# Patient Record
Sex: Male | Born: 1950 | Race: White | Hispanic: No | Marital: Married | Smoking: Former smoker
Health system: Southern US, Community
[De-identification: ages and names within clinical notes are randomized; demographics above are authoritative.]

## PROBLEM LIST (undated history)

## (undated) DIAGNOSIS — E039 Hypothyroidism, unspecified: Secondary | ICD-10-CM

## (undated) DIAGNOSIS — M545 Low back pain, unspecified: Secondary | ICD-10-CM

## (undated) DIAGNOSIS — F419 Anxiety disorder, unspecified: Secondary | ICD-10-CM

## (undated) DIAGNOSIS — I2510 Atherosclerotic heart disease of native coronary artery without angina pectoris: Secondary | ICD-10-CM

## (undated) DIAGNOSIS — I10 Essential (primary) hypertension: Secondary | ICD-10-CM

## (undated) DIAGNOSIS — C801 Malignant (primary) neoplasm, unspecified: Secondary | ICD-10-CM

## (undated) DIAGNOSIS — L570 Actinic keratosis: Secondary | ICD-10-CM

## (undated) DIAGNOSIS — G4730 Sleep apnea, unspecified: Secondary | ICD-10-CM

## (undated) DIAGNOSIS — R7303 Prediabetes: Secondary | ICD-10-CM

## (undated) DIAGNOSIS — D649 Anemia, unspecified: Secondary | ICD-10-CM

## (undated) DIAGNOSIS — N400 Enlarged prostate without lower urinary tract symptoms: Secondary | ICD-10-CM

## (undated) DIAGNOSIS — M1990 Unspecified osteoarthritis, unspecified site: Secondary | ICD-10-CM

## (undated) DIAGNOSIS — E119 Type 2 diabetes mellitus without complications: Secondary | ICD-10-CM

## (undated) HISTORY — PX: OTHER SURGICAL HISTORY: SHX169

## (undated) HISTORY — PX: THYROID SURGERY: SHX805

## (undated) HISTORY — PX: CORONARY ANGIOPLASTY: SHX604

## (undated) HISTORY — PX: SEPTOPLASTY: SUR1290

## (undated) HISTORY — DX: Type 2 diabetes mellitus without complications: E11.9

## (undated) HISTORY — PX: CARDIAC CATHETERIZATION: SHX172

## (undated) HISTORY — PX: SKIN CANCER EXCISION: SHX779

## (undated) HISTORY — DX: Actinic keratosis: L57.0

## (undated) HISTORY — PX: HERNIA REPAIR: SHX51

---

## 2014-01-14 DEATH — deceased

## 2014-04-08 DIAGNOSIS — I10 Essential (primary) hypertension: Secondary | ICD-10-CM | POA: Insufficient documentation

## 2015-09-28 DIAGNOSIS — G4733 Obstructive sleep apnea (adult) (pediatric): Secondary | ICD-10-CM | POA: Insufficient documentation

## 2015-09-28 DIAGNOSIS — Z9989 Dependence on other enabling machines and devices: Secondary | ICD-10-CM

## 2016-09-26 ENCOUNTER — Other Ambulatory Visit: Payer: Self-pay | Admitting: Otolaryngology

## 2016-09-26 DIAGNOSIS — H903 Sensorineural hearing loss, bilateral: Secondary | ICD-10-CM

## 2016-09-26 DIAGNOSIS — IMO0001 Reserved for inherently not codable concepts without codable children: Secondary | ICD-10-CM

## 2016-10-03 ENCOUNTER — Ambulatory Visit
Admission: RE | Admit: 2016-10-03 | Discharge: 2016-10-03 | Disposition: A | Discharge status: Home / Self Care | Payer: Medicare HMO | Source: Ambulatory Visit | Attending: Otolaryngology | Admitting: Otolaryngology

## 2016-10-03 ENCOUNTER — Other Ambulatory Visit
Admission: RE | Admit: 2016-10-03 | Discharge: 2016-10-03 | Disposition: A | Discharge status: Home / Self Care | Payer: Medicare HMO | Source: Ambulatory Visit | Attending: Otolaryngology | Admitting: Otolaryngology

## 2016-10-03 DIAGNOSIS — R42 Dizziness and giddiness: Secondary | ICD-10-CM | POA: Diagnosis present

## 2016-10-03 DIAGNOSIS — G936 Cerebral edema: Secondary | ICD-10-CM | POA: Diagnosis not present

## 2016-10-03 DIAGNOSIS — IMO0001 Reserved for inherently not codable concepts without codable children: Secondary | ICD-10-CM

## 2016-10-03 DIAGNOSIS — H903 Sensorineural hearing loss, bilateral: Secondary | ICD-10-CM | POA: Diagnosis not present

## 2016-10-03 DIAGNOSIS — H905 Unspecified sensorineural hearing loss: Secondary | ICD-10-CM | POA: Diagnosis present

## 2016-10-03 LAB — CREATININE, SERUM: Creatinine, Ser: 1.01 mg/dL (ref 0.61–1.24)

## 2016-10-03 MED ORDER — GADOBENATE DIMEGLUMINE 529 MG/ML IV SOLN
20.0000 mL | Freq: Once | INTRAVENOUS | Status: AC | PRN
  Administered 2016-10-03: 20 mL via INTRAVENOUS

## 2017-04-24 DIAGNOSIS — H8109 Meniere's disease, unspecified ear: Secondary | ICD-10-CM | POA: Insufficient documentation

## 2017-10-29 DIAGNOSIS — G629 Polyneuropathy, unspecified: Secondary | ICD-10-CM | POA: Insufficient documentation

## 2017-11-07 DIAGNOSIS — R200 Anesthesia of skin: Secondary | ICD-10-CM | POA: Insufficient documentation

## 2017-12-27 DIAGNOSIS — D239 Other benign neoplasm of skin, unspecified: Secondary | ICD-10-CM

## 2017-12-27 HISTORY — DX: Other benign neoplasm of skin, unspecified: D23.9

## 2017-12-31 ENCOUNTER — Ambulatory Visit: Payer: Medicare HMO | Admitting: Internal Medicine

## 2017-12-31 DIAGNOSIS — M5136 Other intervertebral disc degeneration, lumbar region: Secondary | ICD-10-CM | POA: Insufficient documentation

## 2018-01-01 ENCOUNTER — Other Ambulatory Visit: Payer: Self-pay | Admitting: Internal Medicine

## 2018-01-01 DIAGNOSIS — R911 Solitary pulmonary nodule: Secondary | ICD-10-CM

## 2018-01-08 ENCOUNTER — Ambulatory Visit
Admission: RE | Admit: 2018-01-08 | Discharge: 2018-01-08 | Disposition: A | Discharge status: Home / Self Care | Payer: Medicare HMO | Source: Ambulatory Visit | Attending: Internal Medicine | Admitting: Internal Medicine

## 2018-01-08 DIAGNOSIS — R911 Solitary pulmonary nodule: Secondary | ICD-10-CM

## 2018-01-08 DIAGNOSIS — I2510 Atherosclerotic heart disease of native coronary artery without angina pectoris: Secondary | ICD-10-CM | POA: Diagnosis not present

## 2018-01-08 DIAGNOSIS — I700 Atherosclerosis of aorta: Secondary | ICD-10-CM | POA: Diagnosis not present

## 2018-01-08 DIAGNOSIS — R918 Other nonspecific abnormal finding of lung field: Secondary | ICD-10-CM | POA: Diagnosis not present

## 2018-01-08 DIAGNOSIS — K760 Fatty (change of) liver, not elsewhere classified: Secondary | ICD-10-CM | POA: Insufficient documentation

## 2018-01-08 DIAGNOSIS — E890 Postprocedural hypothyroidism: Secondary | ICD-10-CM | POA: Diagnosis not present

## 2018-01-08 DIAGNOSIS — E042 Nontoxic multinodular goiter: Secondary | ICD-10-CM | POA: Diagnosis not present

## 2018-04-14 DIAGNOSIS — M1712 Unilateral primary osteoarthritis, left knee: Secondary | ICD-10-CM | POA: Insufficient documentation

## 2018-05-17 HISTORY — PX: JOINT REPLACEMENT: SHX530

## 2018-05-19 NOTE — Discharge Instructions (Signed)
°  Instructions after Total Knee Replacement ° ° Charlisha Market P. Bobby Barton, Jr., M.D.    ° Dept. of Orthopaedics & Sports Medicine ° [facility] ° [address] ° [place],   [address] ° Phone: [phone]   Fax: [phone] ° °  °DIET: °• Drink plenty of non-alcoholic fluids. °• Resume your normal diet. Include foods high in fiber. ° °ACTIVITY:  °• You may use crutches or a walker with weight-bearing as tolerated, unless instructed otherwise. °• You may be weaned off of the walker or crutches by your Physical Therapist.  °• Do NOT place pillows under the knee. Anything placed under the knee could limit your ability to straighten the knee.   °• Continue doing gentle exercises. Exercising will reduce the pain and swelling, increase motion, and prevent muscle weakness.   °• Please continue to use the TED compression stockings for 6 weeks. You may remove the stockings at night, but should reapply them in the morning. °• Do not drive or operate any equipment until instructed. ° °WOUND CARE:  °• Continue to use the PolarCare or ice packs periodically to reduce pain and swelling. °• You may bathe or shower after the staples are removed at the first office visit following surgery. ° °MEDICATIONS: °• You may resume your regular medications. °• Please take the pain medication as prescribed on the medication. °• Do not take pain medication on an empty stomach. °• You have been given a prescription for a blood thinner (Lovenox or Coumadin). Please take the medication as instructed. (NOTE: After completing a 2 week course of Lovenox, take one Enteric-coated aspirin once a day. This along with elevation will help reduce the possibility of phlebitis in your operated leg.) °• Do not drive or drink alcoholic beverages when taking pain medications. ° °CALL THE OFFICE FOR: °• Temperature above 101 degrees °• Excessive bleeding or drainage on the dressing. °• Excessive swelling, coldness, or paleness of the toes. °• Persistent  nausea and vomiting. ° °FOLLOW-UP:  °• You should have an appointment to return to the office in 10-14 days after surgery. °• Arrangements have been made for continuation of Physical Therapy (either home therapy or outpatient therapy). °  °

## 2018-05-22 ENCOUNTER — Other Ambulatory Visit: Payer: Self-pay

## 2018-05-22 ENCOUNTER — Encounter
Admission: RE | Admit: 2018-05-22 | Discharge: 2018-05-22 | Disposition: A | Discharge status: Home / Self Care | Payer: Medicare HMO | Source: Ambulatory Visit | Attending: Orthopedic Surgery | Admitting: Orthopedic Surgery

## 2018-05-22 DIAGNOSIS — Z01818 Encounter for other preprocedural examination: Secondary | ICD-10-CM | POA: Insufficient documentation

## 2018-05-22 DIAGNOSIS — I447 Left bundle-branch block, unspecified: Secondary | ICD-10-CM | POA: Insufficient documentation

## 2018-05-22 DIAGNOSIS — I10 Essential (primary) hypertension: Secondary | ICD-10-CM | POA: Diagnosis not present

## 2018-05-22 HISTORY — DX: Essential (primary) hypertension: I10

## 2018-05-22 HISTORY — DX: Malignant (primary) neoplasm, unspecified: C80.1

## 2018-05-22 HISTORY — DX: Benign prostatic hyperplasia without lower urinary tract symptoms: N40.0

## 2018-05-22 HISTORY — DX: Anxiety disorder, unspecified: F41.9

## 2018-05-22 HISTORY — DX: Low back pain: M54.5

## 2018-05-22 HISTORY — DX: Sleep apnea, unspecified: G47.30

## 2018-05-22 HISTORY — DX: Low back pain, unspecified: M54.50

## 2018-05-22 HISTORY — DX: Prediabetes: R73.03

## 2018-05-22 HISTORY — DX: Unspecified osteoarthritis, unspecified site: M19.90

## 2018-05-22 LAB — TYPE AND SCREEN
ABO/RH(D): O POS
Antibody Screen: NEGATIVE

## 2018-05-22 LAB — CBC
HCT: 46.8 % (ref 39.0–52.0)
Hemoglobin: 15.6 g/dL (ref 13.0–17.0)
MCH: 29.5 pg (ref 26.0–34.0)
MCHC: 33.3 g/dL (ref 30.0–36.0)
MCV: 88.6 fL (ref 80.0–100.0)
PLATELETS: 279 10*3/uL (ref 150–400)
RBC: 5.28 MIL/uL (ref 4.22–5.81)
RDW: 12.6 % (ref 11.5–15.5)
WBC: 10 10*3/uL (ref 4.0–10.5)
nRBC: 0 % (ref 0.0–0.2)

## 2018-05-22 LAB — URINALYSIS, ROUTINE W REFLEX MICROSCOPIC
Bilirubin Urine: NEGATIVE
Glucose, UA: NEGATIVE mg/dL
Hgb urine dipstick: NEGATIVE
KETONES UR: NEGATIVE mg/dL
LEUKOCYTES UA: NEGATIVE
Nitrite: NEGATIVE
PROTEIN: NEGATIVE mg/dL
Specific Gravity, Urine: 1.017 (ref 1.005–1.030)
pH: 5 (ref 5.0–8.0)

## 2018-05-22 LAB — COMPREHENSIVE METABOLIC PANEL
ALK PHOS: 71 U/L (ref 38–126)
ALT: 30 U/L (ref 0–44)
ANION GAP: 8 (ref 5–15)
AST: 20 U/L (ref 15–41)
Albumin: 3.9 g/dL (ref 3.5–5.0)
BUN: 14 mg/dL (ref 8–23)
CALCIUM: 9.2 mg/dL (ref 8.9–10.3)
CO2: 25 mmol/L (ref 22–32)
Chloride: 106 mmol/L (ref 98–111)
Creatinine, Ser: 0.97 mg/dL (ref 0.61–1.24)
Glucose, Bld: 137 mg/dL — ABNORMAL HIGH (ref 70–99)
Potassium: 3.7 mmol/L (ref 3.5–5.1)
Sodium: 139 mmol/L (ref 135–145)
Total Bilirubin: 0.8 mg/dL (ref 0.3–1.2)
Total Protein: 7.6 g/dL (ref 6.5–8.1)

## 2018-05-22 LAB — SURGICAL PCR SCREEN
MRSA, PCR: NEGATIVE
STAPHYLOCOCCUS AUREUS: NEGATIVE

## 2018-05-22 LAB — SEDIMENTATION RATE: Sed Rate: 7 mm/hr (ref 0–20)

## 2018-05-22 LAB — PROTIME-INR
INR: 0.95
PROTHROMBIN TIME: 12.6 s (ref 11.4–15.2)

## 2018-05-22 LAB — APTT: aPTT: 33 seconds (ref 24–36)

## 2018-05-22 LAB — C-REACTIVE PROTEIN: CRP: 0.8 mg/dL (ref <1.0)

## 2018-05-22 NOTE — Patient Instructions (Signed)
Your procedure is scheduled on: 06/03/18 Mon Report to Same Day Surgery 2nd floor medical mall [facility] Entrance-take elevator on left to 2nd floor.  Check in with surgery information desk.) To find out your arrival time please call [phone] between 1PM - 3PM on 05/31/18 Fri  Remember: Instructions that are not followed completely may result in serious medical risk, up to and including death, or upon the discretion of your surgeon and anesthesiologist your surgery may need to be rescheduled.    _x___ 1. Do not eat food after midnight the night before your procedure. You may drink clear liquids up to 2 hours before you are scheduled to arrive at the hospital for your procedure.  Do not drink clear liquids within 2 hours of your scheduled arrival to the hospital.  Clear liquids include  --Water or Apple juice without pulp  --Clear carbohydrate beverage such as ClearFast or Gatorade  --Black Coffee or Clear Tea (No milk, no creamers, do not add anything to                  the coffee or Tea Type 1 and type 2 diabetics should only drink water.   ____Ensure clear carbohydrate drink on the way to the hospital for bariatric patients  ____Ensure clear carbohydrate drink 3 hours before surgery for Dr Dwyane Luo patients if physician instructed.   No gum chewing or hard candies.     __x__ 2. No Alcohol for 24 hours before or after surgery.   __x__3. No Smoking or e-cigarettes for 24 prior to surgery.  Do not use any chewable tobacco products for at least 6 hour prior to surgery   ____  4. Bring all medications with you on the day of surgery if instructed.    __x__ 5. Notify your doctor if there is any change in your medical condition     (cold, fever, infections).    x___6. On the morning of surgery brush your teeth with toothpaste and water.  You may rinse your mouth with mouth wash if you wish.  Do not swallow any toothpaste or mouthwash.   Do not wear jewelry, make-up, hairpins,  clips or nail polish.  Do not wear lotions, powders, or perfumes. You may wear deodorant.  Do not shave 48 hours prior to surgery. Men may shave face and neck.  Do not bring valuables to the hospital.    [facility] is not responsible for any belongings or valuables.               Contacts, dentures or bridgework may not be worn into surgery.  Leave your suitcase in the car. After surgery it may be brought to your room.  For patients admitted to the hospital, discharge time is determined by your                       treatment team.  _  Patients discharged the day of surgery will not be allowed to drive home.  You will need someone to drive you home and stay with you the night of your procedure.    Please read over the following fact sheets that you were given:   [facility] Preparing for Surgery and or MRSA Information   _x___ Take anti-hypertensive listed below, cardiac, seizure, asthma,     anti-reflux and psychiatric medicines. These include:  1. gabapentin (NEURONTIN) 100 MG capsule  2.  3.  4.  5.  6.  ____Fleets enema or  Magnesium Citrate as directed.   _x___ Use CHG Soap or sage wipes as directed on instruction sheet   ____ Use inhalers on the day of surgery and bring to hospital day of surgery  ____ Stop Metformin and Janumet 2 days prior to surgery.    ____ Take 1/2 of usual insulin dose the night before surgery and none on the morning     surgery.   _x___ Follow recommendations from Cardiologist, Pulmonologist or PCP regarding          stopping Aspirin, Coumadin, Plavix ,Eliquis, Effient, or Pradaxa, and Pletal.  X____Stop Anti-inflammatories such as Advil, Aleve, Ibuprofen, Motrin, Naproxen, Naprosyn, Goodies powders or aspirin products. OK to take Tylenol and                          Celebrex.   _x___ Stop supplements until after surgery.  But may continue Vitamin D, Vitamin B,       and multivitamin.   __x__ Bring C-Pap to the hospital.

## 2018-05-23 NOTE — Pre-Procedure Instructions (Addendum)
EKG KNOWN LBBB COMPARED WITH 2010 EKG CARE EVERYWHERE [facility] FAXED TO DR Marry Guan

## 2018-05-24 LAB — URINE CULTURE: Culture: 80000 — AB

## 2018-05-24 LAB — IGE: IGE (IMMUNOGLOBULIN E), SERUM: 11 [IU]/mL (ref 6–495)

## 2018-06-02 ENCOUNTER — Encounter: Payer: Self-pay | Admitting: Orthopedic Surgery

## 2018-06-02 MED ORDER — CLINDAMYCIN PHOSPHATE 900 MG/50ML IV SOLN
900.0000 mg | INTRAVENOUS | Status: AC
  Administered 2018-06-03: 900 mg via INTRAVENOUS

## 2018-06-02 MED ORDER — TRANEXAMIC ACID-NACL 1000-0.7 MG/100ML-% IV SOLN
1000.0000 mg | INTRAVENOUS | Status: AC
  Administered 2018-06-03: 1000 mg via INTRAVENOUS
  Filled 2018-06-02: qty 100

## 2018-06-03 ENCOUNTER — Inpatient Hospital Stay
Admission: RE | Admit: 2018-06-03 | Discharge: 2018-06-05 | DRG: 470 | Disposition: A | Discharge status: Home Health | Payer: Medicare HMO | Source: Ambulatory Visit | Attending: Orthopedic Surgery | Admitting: Orthopedic Surgery

## 2018-06-03 ENCOUNTER — Other Ambulatory Visit: Payer: Self-pay

## 2018-06-03 ENCOUNTER — Inpatient Hospital Stay: Payer: Medicare HMO | Admitting: Certified Registered Nurse Anesthetist

## 2018-06-03 ENCOUNTER — Inpatient Hospital Stay: Payer: Medicare HMO

## 2018-06-03 ENCOUNTER — Encounter
Admission: RE | Disposition: A | Discharge status: Home Health | Payer: Self-pay | Source: Ambulatory Visit | Attending: Orthopedic Surgery

## 2018-06-03 ENCOUNTER — Encounter: Payer: Self-pay | Admitting: *Deleted

## 2018-06-03 DIAGNOSIS — Z7951 Long term (current) use of inhaled steroids: Secondary | ICD-10-CM

## 2018-06-03 DIAGNOSIS — M1712 Unilateral primary osteoarthritis, left knee: Principal | ICD-10-CM | POA: Diagnosis present

## 2018-06-03 DIAGNOSIS — Z79899 Other long term (current) drug therapy: Secondary | ICD-10-CM

## 2018-06-03 DIAGNOSIS — Z7982 Long term (current) use of aspirin: Secondary | ICD-10-CM

## 2018-06-03 DIAGNOSIS — Z7952 Long term (current) use of systemic steroids: Secondary | ICD-10-CM

## 2018-06-03 DIAGNOSIS — R0602 Shortness of breath: Secondary | ICD-10-CM

## 2018-06-03 DIAGNOSIS — N400 Enlarged prostate without lower urinary tract symptoms: Secondary | ICD-10-CM | POA: Diagnosis present

## 2018-06-03 DIAGNOSIS — Z87891 Personal history of nicotine dependence: Secondary | ICD-10-CM | POA: Diagnosis not present

## 2018-06-03 DIAGNOSIS — Z85828 Personal history of other malignant neoplasm of skin: Secondary | ICD-10-CM

## 2018-06-03 DIAGNOSIS — R7303 Prediabetes: Secondary | ICD-10-CM | POA: Diagnosis present

## 2018-06-03 DIAGNOSIS — F419 Anxiety disorder, unspecified: Secondary | ICD-10-CM | POA: Diagnosis present

## 2018-06-03 DIAGNOSIS — I10 Essential (primary) hypertension: Secondary | ICD-10-CM | POA: Diagnosis present

## 2018-06-03 DIAGNOSIS — Z96659 Presence of unspecified artificial knee joint: Secondary | ICD-10-CM

## 2018-06-03 HISTORY — PX: KNEE ARTHROPLASTY: SHX992

## 2018-06-03 LAB — ABO/RH: ABO/RH(D): O POS

## 2018-06-03 SURGERY — ARTHROPLASTY, KNEE, TOTAL, USING IMAGELESS COMPUTER-ASSISTED NAVIGATION
Anesthesia: General | Site: Knee | Laterality: Left

## 2018-06-03 MED ORDER — FENTANYL CITRATE (PF) 100 MCG/2ML IJ SOLN: 25.0000 ug | INTRAMUSCULAR | Status: DC | PRN

## 2018-06-03 MED ORDER — TRAMADOL HCL 50 MG PO TABS
50.0000 mg | ORAL_TABLET | ORAL | Status: DC | PRN
  Administered 2018-06-03 – 2018-06-04 (×4): 100 mg via ORAL
  Filled 2018-06-03 (×4): qty 2

## 2018-06-03 MED ORDER — OXYCODONE HCL 5 MG PO TABS
5.0000 mg | ORAL_TABLET | ORAL | Status: DC | PRN
  Administered 2018-06-03 – 2018-06-05 (×4): 5 mg via ORAL
  Filled 2018-06-03 (×4): qty 1

## 2018-06-03 MED ORDER — GABAPENTIN 300 MG PO CAPS: 300.0000 mg | ORAL_CAPSULE | Freq: Once | ORAL | Status: DC

## 2018-06-03 MED ORDER — SODIUM CHLORIDE 0.9 % IV SOLN
INTRAVENOUS | Status: DC
  Administered 2018-06-03: 14:00:00 via INTRAVENOUS

## 2018-06-03 MED ORDER — HYDROMORPHONE HCL 1 MG/ML IJ SOLN: 0.5000 mg | INTRAMUSCULAR | Status: DC | PRN

## 2018-06-03 MED ORDER — DIPHENHYDRAMINE HCL 12.5 MG/5ML PO ELIX: 12.5000 mg | ORAL_SOLUTION | ORAL | Status: DC | PRN

## 2018-06-03 MED ORDER — BENAZEPRIL HCL 20 MG PO TABS
20.0000 mg | ORAL_TABLET | Freq: Every day | ORAL | Status: DC
  Administered 2018-06-05: 20 mg via ORAL
  Filled 2018-06-03 (×3): qty 1

## 2018-06-03 MED ORDER — ONDANSETRON HCL 4 MG PO TABS: 4.0000 mg | ORAL_TABLET | Freq: Four times a day (QID) | ORAL | Status: DC | PRN

## 2018-06-03 MED ORDER — SODIUM CHLORIDE 0.9 % IV SOLN
INTRAVENOUS | Status: DC | PRN
  Administered 2018-06-03: 30 ug/min via INTRAVENOUS

## 2018-06-03 MED ORDER — CLINDAMYCIN PHOSPHATE 900 MG/50ML IV SOLN
INTRAVENOUS | Status: AC
  Filled 2018-06-03: qty 50

## 2018-06-03 MED ORDER — POLYVINYL ALCOHOL 1.4 % OP SOLN
1.0000 [drp] | OPHTHALMIC | Status: DC | PRN
  Filled 2018-06-03: qty 15

## 2018-06-03 MED ORDER — BUPIVACAINE HCL (PF) 0.25 % IJ SOLN
INTRAMUSCULAR | Status: AC
  Filled 2018-06-03: qty 60

## 2018-06-03 MED ORDER — CALCIUM POLYCARBOPHIL 625 MG PO TABS
625.0000 mg | ORAL_TABLET | Freq: Every day | ORAL | Status: DC
  Administered 2018-06-03 – 2018-06-05 (×3): 625 mg via ORAL
  Filled 2018-06-03 (×3): qty 1

## 2018-06-03 MED ORDER — GABAPENTIN 300 MG PO CAPS
ORAL_CAPSULE | ORAL | Status: AC
  Administered 2018-06-03: 200 mg via ORAL
  Filled 2018-06-03: qty 1

## 2018-06-03 MED ORDER — MAGNESIUM HYDROXIDE 400 MG/5ML PO SUSP
30.0000 mL | Freq: Every day | ORAL | Status: DC
  Administered 2018-06-04: 30 mL via ORAL
  Filled 2018-06-03 (×2): qty 30

## 2018-06-03 MED ORDER — METOCLOPRAMIDE HCL 10 MG PO TABS: 5.0000 mg | ORAL_TABLET | Freq: Three times a day (TID) | ORAL | Status: DC | PRN

## 2018-06-03 MED ORDER — ONDANSETRON HCL 4 MG/2ML IJ SOLN: 4.0000 mg | Freq: Four times a day (QID) | INTRAMUSCULAR | Status: DC | PRN

## 2018-06-03 MED ORDER — DEXAMETHASONE SODIUM PHOSPHATE 10 MG/ML IJ SOLN
INTRAMUSCULAR | Status: AC
  Administered 2018-06-03: 8 mg via INTRAVENOUS
  Filled 2018-06-03: qty 1

## 2018-06-03 MED ORDER — ADULT MULTIVITAMIN W/MINERALS CH
1.0000 | ORAL_TABLET | Freq: Every day | ORAL | Status: DC
  Administered 2018-06-03 – 2018-06-05 (×3): 1 via ORAL
  Filled 2018-06-03 (×3): qty 1

## 2018-06-03 MED ORDER — PHENOL 1.4 % MT LIQD
1.0000 | OROMUCOSAL | Status: DC | PRN
  Filled 2018-06-03: qty 177

## 2018-06-03 MED ORDER — MIDAZOLAM HCL 5 MG/5ML IJ SOLN
INTRAMUSCULAR | Status: DC | PRN
  Administered 2018-06-03: 1.5 mg via INTRAVENOUS

## 2018-06-03 MED ORDER — ALUM & MAG HYDROXIDE-SIMETH 200-200-20 MG/5ML PO SUSP: 30.0000 mL | ORAL | Status: DC | PRN

## 2018-06-03 MED ORDER — FLEET ENEMA 7-19 GM/118ML RE ENEM: 1.0000 | ENEMA | Freq: Once | RECTAL | Status: DC | PRN

## 2018-06-03 MED ORDER — DEXAMETHASONE SODIUM PHOSPHATE 10 MG/ML IJ SOLN
8.0000 mg | Freq: Once | INTRAMUSCULAR | Status: AC
  Administered 2018-06-03: 8 mg via INTRAVENOUS

## 2018-06-03 MED ORDER — PROPOFOL 500 MG/50ML IV EMUL
INTRAVENOUS | Status: AC
  Filled 2018-06-03: qty 50

## 2018-06-03 MED ORDER — OXYCODONE HCL 5 MG/5ML PO SOLN: 5.0000 mg | Freq: Once | ORAL | Status: DC | PRN

## 2018-06-03 MED ORDER — GABAPENTIN 300 MG PO CAPS
300.0000 mg | ORAL_CAPSULE | Freq: Every day | ORAL | Status: DC
  Filled 2018-06-03 (×2): qty 1

## 2018-06-03 MED ORDER — GABAPENTIN 100 MG PO CAPS
100.0000 mg | ORAL_CAPSULE | Freq: Two times a day (BID) | ORAL | Status: DC
  Administered 2018-06-03 – 2018-06-05 (×7): 100 mg via ORAL
  Filled 2018-06-03 (×5): qty 1

## 2018-06-03 MED ORDER — CLINDAMYCIN PHOSPHATE 600 MG/50ML IV SOLN
600.0000 mg | Freq: Four times a day (QID) | INTRAVENOUS | Status: AC
  Administered 2018-06-03 – 2018-06-04 (×4): 600 mg via INTRAVENOUS
  Filled 2018-06-03 (×5): qty 50

## 2018-06-03 MED ORDER — MIDAZOLAM HCL 2 MG/2ML IJ SOLN
INTRAMUSCULAR | Status: AC
  Filled 2018-06-03: qty 2

## 2018-06-03 MED ORDER — PANTOPRAZOLE SODIUM 40 MG PO TBEC
40.0000 mg | DELAYED_RELEASE_TABLET | Freq: Two times a day (BID) | ORAL | Status: DC
  Administered 2018-06-03 – 2018-06-05 (×4): 40 mg via ORAL
  Filled 2018-06-03 (×4): qty 1

## 2018-06-03 MED ORDER — ALPRAZOLAM 0.5 MG PO TABS
0.5000 mg | ORAL_TABLET | Freq: Every evening | ORAL | Status: DC | PRN
  Administered 2018-06-04: 0.5 mg via ORAL
  Filled 2018-06-03: qty 1

## 2018-06-03 MED ORDER — MENTHOL 3 MG MT LOZG
1.0000 | LOZENGE | OROMUCOSAL | Status: DC | PRN
  Filled 2018-06-03: qty 9

## 2018-06-03 MED ORDER — BUPIVACAINE LIPOSOME 1.3 % IJ SUSP
INTRAMUSCULAR | Status: AC
  Filled 2018-06-03: qty 20

## 2018-06-03 MED ORDER — ACETAMINOPHEN 325 MG PO TABS: 325.0000 mg | ORAL_TABLET | Freq: Four times a day (QID) | ORAL | Status: DC | PRN

## 2018-06-03 MED ORDER — CELECOXIB 200 MG PO CAPS
ORAL_CAPSULE | ORAL | Status: AC
  Administered 2018-06-03: 400 mg via ORAL
  Filled 2018-06-03: qty 2

## 2018-06-03 MED ORDER — METOCLOPRAMIDE HCL 10 MG PO TABS
10.0000 mg | ORAL_TABLET | Freq: Three times a day (TID) | ORAL | Status: AC
  Administered 2018-06-03 – 2018-06-05 (×8): 10 mg via ORAL
  Filled 2018-06-03 (×8): qty 1

## 2018-06-03 MED ORDER — SAW PALMETTO (SERENOA REPENS) 500 MG PO CAPS: 500.0000 mg | ORAL_CAPSULE | Freq: Every day | ORAL | Status: DC

## 2018-06-03 MED ORDER — METOCLOPRAMIDE HCL 5 MG/ML IJ SOLN: 5.0000 mg | Freq: Three times a day (TID) | INTRAMUSCULAR | Status: DC | PRN

## 2018-06-03 MED ORDER — CELECOXIB 200 MG PO CAPS
400.0000 mg | ORAL_CAPSULE | Freq: Once | ORAL | Status: AC
  Administered 2018-06-03: 400 mg via ORAL

## 2018-06-03 MED ORDER — TETRACAINE HCL 1 % IJ SOLN
INTRAMUSCULAR | Status: DC | PRN
  Administered 2018-06-03: 4 mg via INTRASPINAL

## 2018-06-03 MED ORDER — CHLORHEXIDINE GLUCONATE 4 % EX LIQD: 60.0000 mL | Freq: Once | CUTANEOUS | Status: DC

## 2018-06-03 MED ORDER — ACETAMINOPHEN 10 MG/ML IV SOLN
1000.0000 mg | Freq: Four times a day (QID) | INTRAVENOUS | Status: AC
  Administered 2018-06-03 – 2018-06-04 (×4): 1000 mg via INTRAVENOUS
  Filled 2018-06-03 (×4): qty 100

## 2018-06-03 MED ORDER — ACETAMINOPHEN 10 MG/ML IV SOLN
INTRAVENOUS | Status: AC
  Filled 2018-06-03: qty 100

## 2018-06-03 MED ORDER — OXYCODONE HCL 5 MG PO TABS
10.0000 mg | ORAL_TABLET | ORAL | Status: DC | PRN
  Administered 2018-06-03: 10 mg via ORAL
  Filled 2018-06-03: qty 2

## 2018-06-03 MED ORDER — FENTANYL CITRATE (PF) 100 MCG/2ML IJ SOLN
INTRAMUSCULAR | Status: AC
  Filled 2018-06-03: qty 2

## 2018-06-03 MED ORDER — CELECOXIB 200 MG PO CAPS
200.0000 mg | ORAL_CAPSULE | Freq: Two times a day (BID) | ORAL | Status: DC
  Administered 2018-06-03 – 2018-06-05 (×5): 200 mg via ORAL
  Filled 2018-06-03 (×5): qty 1

## 2018-06-03 MED ORDER — GLYCOPYRROLATE 0.2 MG/ML IJ SOLN
INTRAMUSCULAR | Status: AC
  Filled 2018-06-03: qty 1

## 2018-06-03 MED ORDER — SODIUM CHLORIDE 0.9 % IV SOLN
INTRAVENOUS | Status: DC | PRN
  Administered 2018-06-03: 60 mL

## 2018-06-03 MED ORDER — LACTATED RINGERS IV SOLN
INTRAVENOUS | Status: DC
  Administered 2018-06-03 (×2): via INTRAVENOUS

## 2018-06-03 MED ORDER — NEOMYCIN-POLYMYXIN B GU 40-200000 IR SOLN
Status: DC | PRN
  Administered 2018-06-03: 14 mL

## 2018-06-03 MED ORDER — NEOMYCIN-POLYMYXIN B GU 40-200000 IR SOLN
Status: AC
  Filled 2018-06-03: qty 20

## 2018-06-03 MED ORDER — BUPIVACAINE HCL (PF) 0.25 % IJ SOLN
INTRAMUSCULAR | Status: DC | PRN
  Administered 2018-06-03: 60 mL

## 2018-06-03 MED ORDER — FAMOTIDINE 20 MG PO TABS
ORAL_TABLET | ORAL | Status: AC
  Administered 2018-06-03: 20 mg via ORAL
  Filled 2018-06-03: qty 1

## 2018-06-03 MED ORDER — FAMOTIDINE 20 MG PO TABS
20.0000 mg | ORAL_TABLET | Freq: Once | ORAL | Status: AC
  Administered 2018-06-03: 20 mg via ORAL

## 2018-06-03 MED ORDER — FINASTERIDE 5 MG PO TABS
5.0000 mg | ORAL_TABLET | Freq: Every day | ORAL | Status: DC
  Administered 2018-06-03 – 2018-06-05 (×2): 5 mg via ORAL
  Filled 2018-06-03 (×2): qty 1

## 2018-06-03 MED ORDER — FENTANYL CITRATE (PF) 100 MCG/2ML IJ SOLN
INTRAMUSCULAR | Status: DC | PRN
  Administered 2018-06-03: 75 ug via INTRAVENOUS

## 2018-06-03 MED ORDER — AMLODIPINE BESY-BENAZEPRIL HCL 5-20 MG PO CAPS: 1.0000 | ORAL_CAPSULE | Freq: Every day | ORAL | Status: DC

## 2018-06-03 MED ORDER — LIDOCAINE HCL (CARDIAC) PF 100 MG/5ML IV SOSY
PREFILLED_SYRINGE | INTRAVENOUS | Status: DC | PRN
  Administered 2018-06-03: 100 mg via INTRAVENOUS

## 2018-06-03 MED ORDER — FLUTICASONE PROPIONATE 50 MCG/ACT NA SUSP
2.0000 | Freq: Every evening | NASAL | Status: DC | PRN
  Filled 2018-06-03: qty 16

## 2018-06-03 MED ORDER — TRANEXAMIC ACID-NACL 1000-0.7 MG/100ML-% IV SOLN
1000.0000 mg | Freq: Once | INTRAVENOUS | Status: AC
  Administered 2018-06-03: 1000 mg via INTRAVENOUS
  Filled 2018-06-03: qty 100

## 2018-06-03 MED ORDER — FERROUS SULFATE 325 (65 FE) MG PO TABS
325.0000 mg | ORAL_TABLET | Freq: Two times a day (BID) | ORAL | Status: DC
  Administered 2018-06-03 – 2018-06-05 (×4): 325 mg via ORAL
  Filled 2018-06-03 (×4): qty 1

## 2018-06-03 MED ORDER — BISACODYL 10 MG RE SUPP
10.0000 mg | Freq: Every day | RECTAL | Status: DC | PRN
  Administered 2018-06-05: 10 mg via RECTAL
  Filled 2018-06-03: qty 1

## 2018-06-03 MED ORDER — AMLODIPINE BESYLATE 5 MG PO TABS
5.0000 mg | ORAL_TABLET | Freq: Every day | ORAL | Status: DC
  Administered 2018-06-04 – 2018-06-05 (×2): 5 mg via ORAL
  Filled 2018-06-03 (×3): qty 1

## 2018-06-03 MED ORDER — PROPOFOL 10 MG/ML IV BOLUS
INTRAVENOUS | Status: DC | PRN
  Administered 2018-06-03 (×3): 20 mg via INTRAVENOUS

## 2018-06-03 MED ORDER — BUPIVACAINE HCL (PF) 0.5 % IJ SOLN
INTRAMUSCULAR | Status: DC | PRN
  Administered 2018-06-03: 2.6 mL

## 2018-06-03 MED ORDER — SODIUM CHLORIDE FLUSH 0.9 % IV SOLN
INTRAVENOUS | Status: AC
  Filled 2018-06-03: qty 40

## 2018-06-03 MED ORDER — TRIAMCINOLONE ACETONIDE 0.1 % EX CREA
1.0000 "application " | TOPICAL_CREAM | Freq: Every day | CUTANEOUS | Status: DC | PRN
  Filled 2018-06-03: qty 15

## 2018-06-03 MED ORDER — SENNOSIDES-DOCUSATE SODIUM 8.6-50 MG PO TABS
1.0000 | ORAL_TABLET | Freq: Two times a day (BID) | ORAL | Status: DC
  Administered 2018-06-03 – 2018-06-05 (×5): 1 via ORAL
  Filled 2018-06-03 (×5): qty 1

## 2018-06-03 MED ORDER — PHENYLEPHRINE HCL 10 MG/ML IJ SOLN
INTRAMUSCULAR | Status: AC
  Filled 2018-06-03: qty 1

## 2018-06-03 MED ORDER — PROPOFOL 500 MG/50ML IV EMUL
INTRAVENOUS | Status: DC | PRN
  Administered 2018-06-03: 65 ug/kg/min via INTRAVENOUS

## 2018-06-03 MED ORDER — ACETAMINOPHEN 10 MG/ML IV SOLN
INTRAVENOUS | Status: DC | PRN
  Administered 2018-06-03: 1000 mg via INTRAVENOUS

## 2018-06-03 MED ORDER — LIDOCAINE HCL (PF) 2 % IJ SOLN
INTRAMUSCULAR | Status: AC
  Filled 2018-06-03: qty 10

## 2018-06-03 MED ORDER — GABAPENTIN 100 MG PO CAPS
200.0000 mg | ORAL_CAPSULE | Freq: Once | ORAL | Status: AC
  Administered 2018-06-03: 200 mg via ORAL
  Filled 2018-06-03: qty 2

## 2018-06-03 MED ORDER — OXYCODONE HCL 5 MG PO TABS: 5.0000 mg | ORAL_TABLET | Freq: Once | ORAL | Status: DC | PRN

## 2018-06-03 MED ORDER — ENOXAPARIN SODIUM 30 MG/0.3ML ~~LOC~~ SOLN
30.0000 mg | Freq: Two times a day (BID) | SUBCUTANEOUS | Status: DC
  Administered 2018-06-04 – 2018-06-05 (×3): 30 mg via SUBCUTANEOUS
  Filled 2018-06-03 (×3): qty 0.3

## 2018-06-03 SURGICAL SUPPLY — 77 items
AMK Fixation Pin ×3 IMPLANT
ATTUNE MED DOME PAT 38 KNEE (Knees) ×2 IMPLANT
ATTUNE MED DOME PAT 38MM KNEE (Knees) ×1 IMPLANT
ATTUNE PS FEM LT SZ 6 CEM KNEE (Femur) ×3 IMPLANT
ATTUNE PSRP INSR SZ6 5 KNEE (Insert) ×2 IMPLANT
ATTUNE PSRP INSR SZ6 5MM KNEE (Insert) ×1 IMPLANT
BASE TIBIAL ROT PLAT SZ 7 KNEE (Knees) ×1 IMPLANT
BATTERY INSTRU NAVIGATION (MISCELLANEOUS) ×12 IMPLANT
BLADE SAW 70X12.5 (BLADE) ×3 IMPLANT
BLADE SAW 90X13X1.19 OSCILLAT (BLADE) ×3 IMPLANT
BLADE SAW 90X25X1.19 OSCILLAT (BLADE) ×3 IMPLANT
CANISTER SUCT 1200ML W/VALVE (MISCELLANEOUS) ×3 IMPLANT
CANISTER SUCT 3000ML PPV (MISCELLANEOUS) ×6 IMPLANT
CATH COUDE FOLEY 2W 5CC 16FR (CATHETERS) ×3 IMPLANT
CEMENT HV SMART SET (Cement) ×6 IMPLANT
COOLER POLAR GLACIER W/PUMP (MISCELLANEOUS) ×3 IMPLANT
COVER WAND RF STERILE (DRAPES) ×3 IMPLANT
CUFF TOURN 24 STER (MISCELLANEOUS) IMPLANT
CUFF TOURN 30 STER DUAL PORT (MISCELLANEOUS) ×3 IMPLANT
DRAPE SHEET LG 3/4 BI-LAMINATE (DRAPES) ×3 IMPLANT
DRSG DERMACEA 8X12 NADH (GAUZE/BANDAGES/DRESSINGS) ×3 IMPLANT
DRSG OPSITE POSTOP 4X14 (GAUZE/BANDAGES/DRESSINGS) ×3 IMPLANT
DRSG TEGADERM 4X4.75 (GAUZE/BANDAGES/DRESSINGS) ×3 IMPLANT
DURAPREP 26ML APPLICATOR (WOUND CARE) ×6 IMPLANT
ELECT CAUTERY BLADE 6.4 (BLADE) ×3 IMPLANT
ELECT REM PT RETURN 9FT ADLT (ELECTROSURGICAL) ×3
ELECTRODE REM PT RTRN 9FT ADLT (ELECTROSURGICAL) ×1 IMPLANT
EX-PIN ORTHOLOCK NAV 4X150 (PIN) ×6 IMPLANT
GLOVE BIOGEL M STRL SZ7.5 (GLOVE) ×6 IMPLANT
GLOVE BIOGEL PI IND STRL 7.5 (GLOVE) ×7 IMPLANT
GLOVE BIOGEL PI IND STRL 9 (GLOVE) ×1 IMPLANT
GLOVE BIOGEL PI INDICATOR 7.5 (GLOVE) ×14
GLOVE BIOGEL PI INDICATOR 9 (GLOVE) ×2
GLOVE INDICATOR 8.0 STRL GRN (GLOVE) ×3 IMPLANT
GLOVE SURG SYN 9.0  PF PI (GLOVE) ×2
GLOVE SURG SYN 9.0 PF PI (GLOVE) ×1 IMPLANT
GOWN STRL REUS W/ TWL LRG LVL3 (GOWN DISPOSABLE) ×2 IMPLANT
GOWN STRL REUS W/TWL 2XL LVL3 (GOWN DISPOSABLE) ×3 IMPLANT
GOWN STRL REUS W/TWL LRG LVL3 (GOWN DISPOSABLE) ×4
HEMOVAC 400CC 10FR (MISCELLANEOUS) ×3 IMPLANT
HOLDER FOLEY CATH W/STRAP (MISCELLANEOUS) ×3 IMPLANT
HOOD PEEL AWAY FLYTE STAYCOOL (MISCELLANEOUS) ×9 IMPLANT
KIT TURNOVER KIT A (KITS) ×3 IMPLANT
KNIFE SCULPS 14X20 (INSTRUMENTS) ×3 IMPLANT
LABEL OR SOLS (LABEL) ×3 IMPLANT
NDL SAFETY ECLIPSE 18X1.5 (NEEDLE) ×1 IMPLANT
NEEDLE HYPO 18GX1.5 SHARP (NEEDLE) ×2
NEEDLE SPNL 20GX3.5 QUINCKE YW (NEEDLE) ×6 IMPLANT
NS IRRIG 500ML POUR BTL (IV SOLUTION) ×3 IMPLANT
PACK TOTAL KNEE (MISCELLANEOUS) ×3 IMPLANT
PAD WRAPON POLAR KNEE (MISCELLANEOUS) ×1 IMPLANT
PENCIL SMOKE ULTRAEVAC 22 CON (MISCELLANEOUS) ×3 IMPLANT
PIN DRILL QUICK PACK ×3 IMPLANT
PIN FIXATION 1/8DIA X 3INL (PIN) ×9 IMPLANT
PIN FIXATION 1/8X3 AMK (Pin) ×9 IMPLANT
PULSAVAC PLUS IRRIG FAN TIP (DISPOSABLE) ×3
SOL .9 NS 3000ML IRR  AL (IV SOLUTION) ×2
SOL .9 NS 3000ML IRR UROMATIC (IV SOLUTION) ×1 IMPLANT
SOL PREP PVP 2OZ (MISCELLANEOUS) ×3
SOLUTION PREP PVP 2OZ (MISCELLANEOUS) ×1 IMPLANT
SPONGE DRAIN TRACH 4X4 STRL 2S (GAUZE/BANDAGES/DRESSINGS) ×3 IMPLANT
STAPLER SKIN PROX 35W (STAPLE) ×3 IMPLANT
STOCKINETTE IMPERV 14X48 (MISCELLANEOUS) ×3 IMPLANT
STRAP TIBIA SHORT (MISCELLANEOUS) ×3 IMPLANT
SUCTION FRAZIER HANDLE 10FR (MISCELLANEOUS) ×2
SUCTION TUBE FRAZIER 10FR DISP (MISCELLANEOUS) ×1 IMPLANT
SUT VIC AB 0 CT1 36 (SUTURE) ×3 IMPLANT
SUT VIC AB 1 CT1 36 (SUTURE) ×6 IMPLANT
SUT VIC AB 2-0 CT2 27 (SUTURE) ×3 IMPLANT
SYR 20CC LL (SYRINGE) ×3 IMPLANT
SYR 30ML LL (SYRINGE) ×6 IMPLANT
TIBIAL BASE ROT PLAT SZ 7 KNEE (Knees) ×3 IMPLANT
TIP FAN IRRIG PULSAVAC PLUS (DISPOSABLE) ×1 IMPLANT
TOWEL OR 17X26 4PK STRL BLUE (TOWEL DISPOSABLE) ×3 IMPLANT
TOWER CARTRIDGE SMART MIX (DISPOSABLE) ×3 IMPLANT
TRAY FOLEY MTR SLVR 16FR STAT (SET/KITS/TRAYS/PACK) ×3 IMPLANT
WRAPON POLAR PAD KNEE (MISCELLANEOUS) ×3

## 2018-06-03 NOTE — Transfer of Care (Signed)
Immediate Anesthesia Transfer of Care Note  Patient: Jackson Ormiston Sr.  Procedure(s) Performed: COMPUTER ASSISTED TOTAL KNEE ARTHROPLASTY (Left Knee)  Patient Location: PACU  Anesthesia Type:Spinal  Level of Consciousness: awake, alert , oriented and patient cooperative  Airway & Oxygen Therapy: Patient Spontanous Breathing and Patient connected to nasal cannula oxygen  Post-op Assessment: Report given to RN and Post -op Vital signs reviewed and stable  Post vital signs: Reviewed and stable  Last Vitals:  Vitals Value Taken Time  BP    Temp    Pulse 87 06/03/2018 10:39 AM  Resp 18 06/03/2018 10:39 AM  SpO2 95 % 06/03/2018 10:39 AM  Vitals shown include unvalidated device data.  Last Pain:  Vitals:   06/03/18 0622  TempSrc:   PainSc: 2          Complications: No apparent anesthesia complications

## 2018-06-03 NOTE — Anesthesia Post-op Follow-up Note (Signed)
Anesthesia QCDR form completed.        

## 2018-06-03 NOTE — Progress Notes (Signed)
New Admit  Arrival Method: From OR Mental Orientation: A&OX4 Assessment: Pt has numbness from knees down, and no movement in ankles bilaterally due to spinal. Pt in no acute distress. Denies pain at this time. No adventitious heart or lung sounds. Educated on incentive spirometry and bone foam use, as well as not placing anything under the operative knee. Surgical incision intact, no drainage on dressing. Hemovac intact and charged. Skin: Surgical incision to L Knee. Dry skin. Pt has skin tear to LLE under dressing per the pt, RN unable to view. Safety Measures: RN explained call bell system and other safety measures to pt and pt verbalizes understanding. Bed alarm on, yellow armband applied. Admission: Completed 1A Orientation: Complete Family: updated on POC, at bedside.

## 2018-06-03 NOTE — Anesthesia Preprocedure Evaluation (Addendum)
Anesthesia Evaluation  Patient identified by MRN, date of birth, ID band Patient awake    Reviewed: Allergy & Precautions, H&P , NPO status , Patient's Chart, lab work & pertinent test results  Airway Mallampati: III       Dental   Pulmonary sleep apnea , former smoker,           Cardiovascular hypertension, negative cardio ROS       Neuro/Psych Anxiety negative neurological ROS  negative psych ROS   GI/Hepatic negative GI ROS, Neg liver ROS,   Endo/Other  negative endocrine ROS  Renal/GU      Musculoskeletal  (+) Arthritis ,   Abdominal   Peds  Hematology negative hematology ROS (+)   Anesthesia Other Findings Past Medical History: No date: Anxiety No date: Arthritis No date: Cancer ([place])     Comment:  skin No date: Hypertension No date: Lower back pain No date: Pre-diabetes No date: Prostate enlargement No date: Sleep apnea  Past Surgical History: No date: broken wrist; Right No date: CARDIAC CATHETERIZATION     Comment:  x 2 No date: HERNIA REPAIR No date: SEPTOPLASTY No date: SKIN CANCER EXCISION No date: THYROID SURGERY     Reproductive/Obstetrics negative OB ROS                            Anesthesia Physical Anesthesia Plan  ASA: III  Anesthesia Plan: Spinal and General   Post-op Pain Management:    Induction:   PONV Risk Score and Plan: Propofol infusion, Ondansetron and Midazolam  Airway Management Planned:   Additional Equipment:   Intra-op Plan:   Post-operative Plan:   Informed Consent: I have reviewed the patients History and Physical, chart, labs and discussed the procedure including the risks, benefits and alternatives for the proposed anesthesia with the patient or authorized representative who has indicated his/her understanding and acceptance.   Dental Advisory Given  Plan Discussed with: Anesthesiologist  Anesthesia Plan Comments:         Anesthesia Quick Evaluation

## 2018-06-03 NOTE — Progress Notes (Signed)
PHARMACIST - PHYSICIAN ORDER COMMUNICATION  CONCERNING: P&T Medication Policy on Herbal Medications  DESCRIPTION:  This patient's order for:  Saw Palmetto  has been noted.  This product(s) is classified as an "herbal" or natural product. Due to a lack of definitive safety studies or FDA approval, nonstandard manufacturing practices, plus the potential risk of unknown drug-drug interactions while on inpatient medications, the Pharmacy and Therapeutics Committee does not permit the use of "herbal" or natural products of this type within [facility].   ACTION TAKEN: The pharmacy department is unable to verify this order at this time. Please reevaluate patient's clinical condition at discharge and address if the herbal or natural product(s) should be resumed at that time.   Chinita Greenland PharmD Clinical Pharmacist 06/03/2018

## 2018-06-03 NOTE — Anesthesia Procedure Notes (Signed)
Spinal  Patient location during procedure: OR Start time: 06/03/2018 7:25 AM End time: 06/03/2018 7:31 AM Staffing Resident/CRNA: Bernardo Heater, CRNA Performed: resident/CRNA  Preanesthetic Checklist Completed: patient identified, site marked, surgical consent, pre-op evaluation, timeout performed, IV checked, risks and benefits discussed and monitors and equipment checked Spinal Block Patient position: sitting Prep: ChloraPrep Patient monitoring: heart rate, continuous pulse ox, blood pressure and cardiac monitor Approach: midline Location: L3-4 Injection technique: single-shot Needle Needle type: Pencan  Needle gauge: 24 G Needle length: 9 cm Additional Notes Negative paresthesia. Negative blood return. Positive free-flowing CSF. Expiration date of kit checked and confirmed. Patient tolerated procedure well, without complications.

## 2018-06-03 NOTE — Anesthesia Postprocedure Evaluation (Signed)
Anesthesia Post Note  Patient: Jackson Fitzpatrick Sr.  Procedure(s) Performed: COMPUTER ASSISTED TOTAL KNEE ARTHROPLASTY (Left Knee)  Patient location during evaluation: PACU Anesthesia Type: Spinal and General Level of consciousness: awake and alert Pain management: pain level controlled Vital Signs Assessment: post-procedure vital signs reviewed and stable Respiratory status: spontaneous breathing, nonlabored ventilation and respiratory function stable Cardiovascular status: blood pressure returned to baseline and stable Postop Assessment: no apparent nausea or vomiting Anesthetic complications: no     Last Vitals:  Vitals:   06/03/18 1357 06/03/18 1513  BP: (!) 117/59 133/74  Pulse: 83 88  Resp:    Temp: 36.6 C 36.6 C  SpO2: 98% 98%    Last Pain:  Vitals:   06/03/18 1513  TempSrc: Oral  PainSc:                  [name]

## 2018-06-03 NOTE — Op Note (Signed)
OPERATIVE NOTE  DATE OF SURGERY:  06/03/2018  PATIENT NAME:  Jackson Hodgman Sr.   DOB: 1951-05-15  MRN: [id number]  PRE-OPERATIVE DIAGNOSIS: Degenerative arthrosis of the left knee, primary  POST-OPERATIVE DIAGNOSIS:  Same  PROCEDURE:  Left total knee arthroplasty using computer-assisted navigation  SURGEON:  Marciano Sequin. M.D.  ASSISTANT:  Vance Peper, PA (present and scrubbed throughout the case, critical for assistance with exposure, retraction, instrumentation, and closure)  ANESTHESIA: spinal  ESTIMATED BLOOD LOSS: 50 mL  FLUIDS REPLACED: 1000 mL of crystalloid  TOURNIQUET TIME: 78 minutes  DRAINS: 2 medium Hemovac drains  SOFT TISSUE RELEASES: Anterior cruciate ligament, posterior cruciate ligament, deep and superficial medial collateral ligament, patellofemoral ligament  IMPLANTS UTILIZED: DePuy Attune size 6 posterior stabilized femoral component (cemented), size 7 rotating platform tibial component (cemented), 38 mm medialized dome patella (cemented), and a 5 mm stabilized rotating platform polyethylene insert.  INDICATIONS FOR SURGERY: Jackson Goren Sr. is a 67 y.o. year old male with a long history of progressive knee pain. X-rays demonstrated severe degenerative changes in tricompartmental fashion. The patient had not seen any significant improvement despite conservative nonsurgical intervention. After discussion of the risks and benefits of surgical intervention, the patient expressed understanding of the risks benefits and agree with plans for total knee arthroplasty.   The risks, benefits, and alternatives were discussed at length including but not limited to the risks of infection, bleeding, nerve injury, stiffness, blood clots, the need for revision surgery, cardiopulmonary complications, among others, and they were willing to proceed.  PROCEDURE IN DETAIL: The patient was brought into the operating room and, after adequate spinal anesthesia was achieved, a  tourniquet was placed on the patient's upper thigh. The patient's knee and leg were cleaned and prepped with alcohol and DuraPrep and draped in the usual sterile fashion. A "timeout" was performed as per usual protocol. The lower extremity was exsanguinated using an Esmarch, and the tourniquet was inflated to 300 mmHg. An anterior longitudinal incision was made followed by a standard mid vastus approach. The deep fibers of the medial collateral ligament were elevated in a subperiosteal fashion off of the medial flare of the tibia so as to maintain a continuous soft tissue sleeve. The patella was subluxed laterally and the patellofemoral ligament was incised. Inspection of the knee demonstrated severe degenerative changes with full-thickness loss of articular cartilage. Osteophytes were debrided using a rongeur. Anterior and posterior cruciate ligaments were excised. Two 4.0 mm Schanz pins were inserted in the femur and into the tibia for attachment of the array of trackers used for computer-assisted navigation. Hip center was identified using a circumduction technique. Distal landmarks were mapped using the computer. The distal femur and proximal tibia were mapped using the computer. The distal femoral cutting guide was positioned using computer-assisted navigation so as to achieve a 5 distal valgus cut. The femur was sized and it was felt that a size 6 femoral component was appropriate. A size 6 femoral cutting guide was positioned and the anterior cut was performed and verified using the computer. This was followed by completion of the posterior and chamfer cuts. Femoral cutting guide for the central box was then positioned in the center box cut was performed.  Attention was then directed to the proximal tibia. Medial and lateral menisci were excised. The extramedullary tibial cutting guide was positioned using computer-assisted navigation so as to achieve a 0 varus-valgus alignment and 3 posterior slope. The  cut was performed and verified using the computer. The  proximal tibia was sized and it was felt that a size 7 tibial tray was appropriate. Tibial and femoral trials were inserted followed by insertion of a 5 mm polyethylene insert. The knee was felt to be tight medially. A Cobb elevator was used to elevate the superficial fibers of the medial collateral ligament.  This allowed for excellent mediolateral soft tissue balancing both in flexion and in full extension. Finally, the patella was cut and prepared so as to accommodate a 38 mm medialized dome patella. A patella trial was placed and the knee was placed through a range of motion with excellent patellar tracking appreciated. The femoral trial was removed after debridement of posterior osteophytes. The central post-hole for the tibial component was reamed followed by insertion of a keel punch. Tibial trials were then removed. Cut surfaces of bone were irrigated with copious amounts of normal saline with antibiotic solution using pulsatile lavage and then suctioned dry. Polymethylmethacrylate cement was prepared in the usual fashion using a vacuum mixer. Cement was applied to the cut surface of the proximal tibia as well as along the undersurface of a size 7 rotating platform tibial component. Tibial component was positioned and impacted into place. Excess cement was removed using [employer] fast streamer. Cement was then applied to the cut surfaces of the femur as well as along the posterior flanges of the size 6 femoral component. The femoral component was positioned and impacted into place. Excess cement was removed using [employer] fast streamer. A 5 mm polyethylene trial was inserted and the knee was brought into full extension with steady axial compression applied. Finally, cement was applied to the backside of a 38 mm medialized dome patella and the patellar component was positioned and patellar clamp applied. Excess cement was removed using [employer] fast streamer. After adequate  curing of the cement, the tourniquet was deflated after a total tourniquet time of 78 minutes. Hemostasis was achieved using electrocautery. The knee was irrigated with copious amounts of normal saline with antibiotic solution using pulsatile lavage and then suctioned dry. 20 mL of 1.3% Exparel and 60 mL of 0.25% Marcaine in 40 mL of normal saline was injected along the posterior capsule, medial and lateral gutters, and along the arthrotomy site. A 5 mm stabilized rotating platform polyethylene insert was inserted and the knee was placed through a range of motion with excellent mediolateral soft tissue balancing appreciated and excellent patellar tracking noted. 2 medium drains were placed in the wound bed and brought out through separate stab incisions. The medial parapatellar portion of the incision was reapproximated using interrupted sutures of #1 Vicryl. Subcutaneous tissue was approximated in layers using first #0 Vicryl followed #2-0 Vicryl. The skin was approximated with skin staples. A sterile dressing was applied.  The patient tolerated the procedure well and was transported to the recovery room in stable condition.    Jackson Jordan., M.D.

## 2018-06-03 NOTE — Evaluation (Signed)
Physical Therapy Evaluation Patient Details Name: Jackson Kissner Sr. MRN: [id number] DOB: 1951/03/28 Today's Date: 06/03/2018   History of Present Illness  Pt is a 67 y.o. male s/p L TKA secondary degenerative arthrosis 06/03/18.  PMH includes htn, sleep apnea, h/o smoking, anxiety, skin CA, LBP, R broken wrist, cardiac cath x2, and thyroid surgery.  Clinical Impression  Prior to hospital admission, pt was modified independent with ambulation (used SPC PRN).  Pt lives with his wife in 1 level home with 6 steps to enter with B railing.  Currently pt is min assist semi-supine to sit; min to mod assist to stand; and CGA to walk a few feet bed to recliner with RW.  Pt able to perform L LE SLR independently.  Pt appearing anxious at times during session but does well with encouragement.  2/10 pain L knee end of session.  L knee ROM 0-78 degrees.  Pt would benefit from skilled PT to address noted impairments and functional limitations (see below for any additional details).  Upon hospital discharge, recommend pt discharge to home with HHPT.    Follow Up Recommendations Home health PT    Equipment Recommendations  Rolling walker with 5" wheels;3in1 (PT)    Recommendations for Other Services OT consult     Precautions / Restrictions Precautions Precautions: Knee;Fall Precaution Booklet Issued: Yes (comment) Required Braces or Orthoses: Knee Immobilizer - Left Knee Immobilizer - Left: Discontinue once straight leg raise with < 10 degree lag Restrictions Weight Bearing Restrictions: Yes LLE Weight Bearing: Weight bearing as tolerated      Mobility  Bed Mobility Overal bed mobility: Needs Assistance Bed Mobility: Supine to Sit     Supine to sit: Min assist;HOB elevated     General bed mobility comments: assist for L LE; vc's for technique  Transfers Overall transfer level: Needs assistance Equipment used: Rolling walker (2 wheeled) Transfers: Sit to/from Stand Sit to Stand: Min  assist;Mod assist         General transfer comment: mod assist to stand from bed and min assist to stand from recliner; vc's for UE and LE placement; assist to initiate and come to full stand  Ambulation/Gait Ambulation/Gait assistance: Min guard Gait Distance (Feet): 3 Feet(bed to recliner) Assistive device: Rolling walker (2 wheeled)   Gait velocity: decreased   General Gait Details: mild decreased stance time L LE; vc's to increase UE support through RW; steady with RW use; vc's for walker technique/use  Stairs            Wheelchair Mobility    Modified Rankin (Stroke Patients Only)       Balance Overall balance assessment: Needs assistance Sitting-balance support: No upper extremity supported;Feet supported Sitting balance-Leahy Scale: Good Sitting balance - Comments: steady sitting reaching within BOS   Standing balance support: Single extremity supported Standing balance-Leahy Scale: Poor Standing balance comment: pt requiring at least single UE support for static standing balance                             Pertinent Vitals/Pain Pain Assessment: 0-10 Pain Score: 2  Pain Location: L knee Pain Descriptors / Indicators: Sore;Tender Pain Intervention(s): Limited activity within patient's tolerance;Monitored during session;Repositioned;RN gave pain meds during session;Premedicated before session;Other (comment)(polar care applied and activated)  Vitals (HR and O2 on room air) stable and WFL throughout treatment session.    Home Living Family/patient expects to be discharged to:: Private residence Living Arrangements: Spouse/significant other  Available Help at Discharge: Family Type of Home: House Home Access: Stairs to enter Entrance Stairs-Rails: Right;Left(can't reach both) Technical brewer of Steps: 6 Home Layout: One level Home Equipment: Shower seat;Cane - single point      Prior Function Level of Independence: Independent with  assistive device(s)         Comments: Pt uses SPC in community PRN.  No h/o falls in past 6 months.     Hand Dominance        Extremity/Trunk Assessment   Upper Extremity Assessment Upper Extremity Assessment: Overall WFL for tasks assessed    Lower Extremity Assessment Lower Extremity Assessment: RLE deficits/detail;LLE deficits/detail RLE Deficits / Details: strength and ROM WFL LLE Deficits / Details: able to perform L LE SLR independently; at least 3/5 hip flexion and DF LLE: Unable to fully assess due to pain    Cervical / Trunk Assessment Cervical / Trunk Assessment: Normal  Communication   Communication: No difficulties  Cognition Arousal/Alertness: Awake/alert Behavior During Therapy: Anxious Overall Cognitive Status: Within Functional Limits for tasks assessed                                        General Comments General comments (skin integrity, edema, etc.): dressing and hemovac in place L LE.  Nursing cleared pt for participation in physical therapy.  Pt agreeable to PT session.  Pt's wife present during session.    Exercises Total Joint Exercises Ankle Circles/Pumps: AROM;Strengthening;Both;10 reps;Supine Quad Sets: AROM;Strengthening;Both;10 reps;Supine Short Arc Quad: AAROM;Strengthening;Left;10 reps;Supine Heel Slides: AAROM;Strengthening;Left;10 reps;Supine Hip ABduction/ADduction: AAROM;Strengthening;Left;10 reps;Supine Straight Leg Raises: AROM;Strengthening;Left;10 reps;Supine Goniometric ROM: L knee 0-78 degrees AROM   Assessment/Plan    PT Assessment Patient needs continued PT services  PT Problem List Decreased strength;Decreased range of motion;Decreased activity tolerance;Decreased balance;Decreased mobility;Decreased knowledge of use of DME;Decreased knowledge of precautions;Pain;Decreased skin integrity       PT Treatment Interventions DME instruction;Gait training;Stair training;Functional mobility  training;Therapeutic activities;Therapeutic exercise;Balance training;Patient/family education    PT Goals (Current goals can be found in the Care Plan section)  Acute Rehab PT Goals Patient Stated Goal: to go home PT Goal Formulation: With patient Time For Goal Achievement: 06/17/18 Potential to Achieve Goals: Good    Frequency BID   Barriers to discharge        Co-evaluation               AM-PAC PT "6 Clicks" Daily Activity  Outcome Measure Difficulty turning over in bed (including adjusting bedclothes, sheets and blankets)?: A Little Difficulty moving from lying on back to sitting on the side of the bed? : Unable Difficulty sitting down on and standing up from a chair with arms (e.g., wheelchair, bedside commode, etc,.)?: Unable Help needed moving to and from a bed to chair (including a wheelchair)?: A Little Help needed walking in hospital room?: A Little Help needed climbing 3-5 steps with a railing? : A Lot 6 Click Score: 13    End of Session Equipment Utilized During Treatment: Gait belt Activity Tolerance: Patient tolerated treatment well Patient left: in chair;with call bell/phone within reach;with chair alarm set;with family/visitor present;with SCD's reapplied;Other (comment)(B heels elevated via towel rolls; polar care in place and activated) Nurse Communication: Mobility status;Precautions;Weight bearing status PT Visit Diagnosis: Other abnormalities of gait and mobility (R26.89);Muscle weakness (generalized) (M62.81);Difficulty in walking, not elsewhere classified (R26.2);Pain Pain - Right/Left: Left Pain - part of body:  Knee    Time: 1555-1630 PT Time Calculation (min) (ACUTE ONLY): 35 min   Charges:   PT Evaluation $PT Eval Low Complexity: 1 Low PT Treatments $Therapeutic Exercise: 8-22 mins       Leitha Bleak, PT 06/03/18, 5:00 PM [phone]

## 2018-06-03 NOTE — H&P (Signed)
The patient has been re-examined, and the chart reviewed, and there have been no interval changes to the documented history and physical.    The risks, benefits, and alternatives have been discussed at length. The patient expressed understanding of the risks benefits and agreed with plans for surgical intervention.   P. , Jr. M.D.    

## 2018-06-04 ENCOUNTER — Inpatient Hospital Stay: Payer: Medicare HMO

## 2018-06-04 ENCOUNTER — Encounter: Payer: Self-pay | Admitting: Orthopedic Surgery

## 2018-06-04 LAB — TROPONIN I

## 2018-06-04 LAB — TSH: TSH: 0.892 u[IU]/mL (ref 0.350–4.500)

## 2018-06-04 MED ORDER — ALPRAZOLAM 0.25 MG PO TABS
0.2500 mg | ORAL_TABLET | Freq: Three times a day (TID) | ORAL | Status: DC | PRN
  Administered 2018-06-04 – 2018-06-05 (×2): 0.25 mg via ORAL
  Filled 2018-06-04 (×2): qty 1

## 2018-06-04 NOTE — Evaluation (Signed)
Occupational Therapy Evaluation Patient Details Name: Jackson Jordan. MRN: [id number] DOB: 1950/08/21 Today's Date: 06/04/2018    History of Present Illness Pt is a 67 y.o. male s/p L TKA secondary degenerative arthrosis 06/03/18.  PMH includes htn, sleep apnea, h/o smoking, anxiety, skin CA, LBP, R broken wrist, cardiac cath x2, and thyroid surgery.   Clinical Impression   Pt seen for OT evaluation this date, POD#1 from above surgery. Pt was independent in all ADLs, IADLs and mobility prior to surgery.  Pt is eager to return to PLOF with less pain and improved safety and independence. Pt demonstrated impairments in pain, balance, strength, ROM, and knowledge of use in DME/AE and pain requiring minimal assist for LB dressing and bathing while in seated position due to pain and limited AROM of L knee. Pt instructed in polar care mgt, falls prevention strategies, home/routines modifications, DME/AE for LB bathing and dressing tasks, and compression stocking mgt. Pt verbalized understanding of all education presented at this time. Pt would benefit from skilled OT services including additional instruction in dressing techniques with or without assistive devices for dressing and bathing skills to support recall and carryover prior to discharge and ultimately to maximize safety, independence, and minimize falls risk and caregiver burden. Do not currently anticipate any OT needs following this hospitalization.      Follow Up Recommendations  No OT follow up    Equipment Recommendations  3 in 1 bedside commode    Recommendations for Other Services       Precautions / Restrictions Precautions Precautions: Knee;Fall  Required Braces or Orthoses: Knee Immobilizer - Left Knee Immobilizer - Left: Discontinue once straight leg raise with < 10 degree lag Restrictions Weight Bearing Restrictions: Yes LLE Weight Bearing: Weight bearing as tolerated      Mobility Bed Mobility     General bed  mobility comments: Deferred; pt presented in recliner  Transfers Overall transfer level: Needs assistance Equipment used: Rolling walker (2 wheeled) Transfers: Sit to/from Stand Sit to Stand: Min assist         General transfer comment:  vc's for UE placement    Balance Overall balance assessment: Needs assistance Sitting-balance support: Feet supported;No upper extremity supported Sitting balance-Leahy Scale: Good Sitting balance - Comments: steady sitting reaching within BOS   Standing balance support: No upper extremity supported Standing balance-Leahy Scale: Fair Standing balance comment: steady standing reaching within BOS                           ADL either performed or assessed with clinical judgement   ADL Overall ADL's : Needs assistance/impaired Eating/Feeding: Independent;Sitting   Grooming: Independent;Sitting   Upper Body Bathing: Sitting;Supervision/ safety   Lower Body Bathing: Minimal assistance;Sitting/lateral leans   Upper Body Dressing : Supervision/safety;Sitting   Lower Body Dressing: Minimal assistance;With adaptive equipment;Sit to/from Health and safety inspector Details (indicate cue type and reason): deferred; will assess next session         Functional mobility during ADLs: (deferred; will assess next session)       Vision Baseline Vision/History: Wears glasses Wears Glasses: Reading only Patient Visual Report: No change from baseline       Perception     Praxis      Pertinent Vitals/Pain Pain Assessment: 0-10 Pain Score: 1  Pain Location: L knee Pain Descriptors / Indicators: Dull;Aching Pain Intervention(s): Limited activity within patient's tolerance;Monitored during session;Repositioned;Ice applied     Hand  Dominance     Extremity/Trunk Assessment Upper Extremity Assessment Upper Extremity Assessment: Overall WFL for tasks assessed(5/5 grip, shoulder and bi/tricep strength)   Lower Extremity  Assessment Lower Extremity Assessment: LLE deficits/detail;Defer to PT evaluation LLE Deficits / Details: post op expected deficiencies   Cervical / Trunk Assessment Cervical / Trunk Assessment: Normal   Communication Communication Communication: No difficulties   Cognition Arousal/Alertness: Awake/alert Behavior During Therapy: WFL for tasks assessed/performed Overall Cognitive Status: Within Functional Limits for tasks assessed                                     General Comments  dressing and hemovac in place L LE    Exercises  Other Exercises Other Exercises: Pt educated in polar care and compression sock mgt Other Exercises: Pt educated in use of AE for LB dressing Other Exercises: Pt educated in falls prevetion strategies when completing ADL tasks.    Shoulder Instructions      Home Living Family/patient expects to be discharged to:: Private residence Living Arrangements: Spouse/significant other Available Help at Discharge: Family Type of Home: House Home Access: Stairs to enter Technical brewer of Steps: 6 Entrance Stairs-Rails: Right;Left Home Layout: One level     Bathroom Shower/Tub: Tub/shower unit;Tub only   Biochemist, clinical: Standard     Home Equipment: Marine scientist - single point;Hand held shower head          Prior Functioning/Environment Level of Independence: Independent with assistive device(s)        Comments: Pt uses SPC in community PRN.  No h/o falls in past 12 months. Pt independent in ADL and IADL tasks. Was driving and working part time.        OT Problem List: Decreased strength;Decreased knowledge of use of DME or AE;Impaired balance (sitting and/or standing);Decreased range of motion;Pain      OT Treatment/Interventions: Self-care/ADL training;Balance training;Therapeutic exercise;DME and/or AE instruction;Patient/family education    OT Goals(Current goals can be found in the care plan section) Acute  Rehab OT Goals Patient Stated Goal: to go home OT Goal Formulation: With patient Time For Goal Achievement: 06/18/18 Potential to Achieve Goals: Good ADL Goals Pt Will Perform Lower Body Dressing: with supervision;with adaptive equipment;sit to/from stand Additional ADL Goal #1: Pt will educate caregiver in compression sock mgt including donning/doffing, wear schedule and positioning. Additional ADL Goal #2: Pt will educate caregiver in polar care mgt including donning/doffing, wear schedule and positioning.  OT Frequency: Min 1X/week   Barriers to D/C:            Co-evaluation              AM-PAC PT "6 Clicks" Daily Activity     Outcome Measure Help from another person eating meals?: None Help from another person taking care of personal grooming?: None Help from another person toileting, which includes using toliet, bedpan, or urinal?: A Little Help from another person bathing (including washing, rinsing, drying)?: A Little Help from another person to put on and taking off regular upper body clothing?: None Help from another person to put on and taking off regular lower body clothing?: A Little 6 Click Score: 21   End of Session Equipment Utilized During Treatment: Gait belt;Rolling walker  Activity Tolerance: Patient tolerated treatment well Patient left: in chair;with call bell/phone within reach;with chair alarm set;with SCD's reapplied(Nurse tech in room)  OT Visit Diagnosis: Other abnormalities of gait  and mobility (R26.89);Pain Pain - Right/Left: Left Pain - part of body: Knee                Time: 2099-0689 OT Time Calculation (min): 36 min Charges:     Jadene Pierini OTS  06/04/2018, 12:27 PM

## 2018-06-04 NOTE — Care Management Note (Addendum)
Case Management Note  Patient Details  Name: Jackson Ocallaghan Sr. MRN: [id number] Date of Birth: Nov 01, 1950  Subjective/Objective:                   RNCM met with patient to discuss transition of care.  He plans to return to home at discharge with this wife. She will be able to drive him to appointments that are close proximity to this hospital. She does not drive long-distances. He denies problems getting to outpatient PT at [place].  [facility] would [facility].  He will need a front-wheeled walker and requests a bedside commode if his insurance will cover it.  He uses [facility] [phone] for medications.  Dr. Marry Guan would like for patient to have Lovenox '40mg'$  injection daily for 14-days; no refills at discharge.   Action/Plan:  Rolling walker has been delivered to this room; pending insurance regarding bedside commode- covered by insurance and delivered to this room.  Lovenox has been called in to [facility] for price. Home health list provided; referral to [facility]. Lovenox $85- patient aware and agrees.  Expected Discharge Date:  06/06/18               Expected Discharge Plan:     In-House Referral:     Discharge planning Services  CM Consult  Post Acute Care Choice:  Durable Medical Equipment, Home Health Choice offered to:  Patient  DME Arranged:  Walker rolling, 3-N-1 DME Agency:  [place]:  PT [place]:  [place]  Status of Service:  In process, will continue to follow  If discussed at Long Length of Stay Meetings, dates discussed:    Additional Comments:  Acxel Dingee, RN 06/04/2018, 12:11 PM

## 2018-06-04 NOTE — Progress Notes (Signed)
Pt complains of SOB with ambulation. Per pt "I feel woozy and cold, I just dont feel right." HR 110, BP elevated. Otherwise VSS. MD paged. New order for EKG and CXR given. Will continue to monitor.

## 2018-06-04 NOTE — Progress Notes (Signed)
Physical Therapy Treatment Patient Details Name: Jackson Jordan Sr. MRN: [id number] DOB: 09-23-1950 Today's Date: 06/04/2018    History of Present Illness Pt is a 67 y.o. male s/p L TKA secondary degenerative arthrosis 06/03/18.  PMH includes htn, sleep apnea, h/o smoking, anxiety, skin CA, LBP, R broken wrist, cardiac cath x2, and thyroid surgery.    PT Comments    Upon PT arrival, pt reporting feel "off" and unsure if medication related (pt reporting already discussing symptoms with nurse).  Pt able to stand from recliner with CGA and then ambulate 150 feet with RW CGA.  Overall steady with functional mobility and assisted back to bed end of session.  Minimal L knee pain during session.  Pt appearing with mild SOB end of ambulation and after laying down in bed and resting for a few minutes pt still reporting feeling a little SOB (although visually did not appear SOB) and still feeling "off"; nurse notified immediately who reported she would come assess pt.  HR 90 bpm at rest, increased to 111 bpm with activity, and 100 bpm at rest end of session.  O2 sats 94% or greater on room air during session.  Will continue to progress pt with strengthening, knee ROM, progressive ambulation distance, and trial stairs next session as appropriate.    Follow Up Recommendations  Home health PT     Equipment Recommendations  Rolling walker with 5" wheels;3in1 (PT)    Recommendations for Other Services OT consult     Precautions / Restrictions Precautions Precautions: Knee;Fall Precaution Booklet Issued: Yes (comment) Required Braces or Orthoses: Knee Immobilizer - Left Knee Immobilizer - Left: Discontinue once straight leg raise with < 10 degree lag Restrictions Weight Bearing Restrictions: Yes LLE Weight Bearing: Weight bearing as tolerated    Mobility  Bed Mobility Overal bed mobility: Needs Assistance Bed Mobility: Sit to Supine       Sit to supine: Supervision   General bed mobility  comments: mild increased effort to perform on own  Transfers Overall transfer level: Needs assistance Equipment used: Rolling walker (2 wheeled) Transfers: Sit to/from Stand Sit to Stand: Min guard         General transfer comment: occasional vc's for UE/LE placement; strong stand noted from recliner  Ambulation/Gait Ambulation/Gait assistance: Min guard Gait Distance (Feet): 150 Feet Assistive device: Rolling walker (2 wheeled)   Gait velocity: decreased   General Gait Details: partial step through gait pattern; mild decreased stance time L LE; initial vc's to increase UE support through RW; intermittent vc's to stay closer to RW; steady with RW   Stairs             Wheelchair Mobility    Modified Rankin (Stroke Patients Only)       Balance Overall balance assessment: Needs assistance Sitting-balance support: Feet supported;No upper extremity supported Sitting balance-Leahy Scale: Normal Sitting balance - Comments: steady sitting reaching outside BOS   Standing balance support: No upper extremity supported Standing balance-Leahy Scale: Good Standing balance comment: steady standing reaching within BOS                            Cognition Arousal/Alertness: Awake/alert Behavior During Therapy: WFL for tasks assessed/performed Overall Cognitive Status: Within Functional Limits for tasks assessed  Exercises Total Joint Exercises Long Arc Quad: AROM;Right;AAROM;Left;Seated;10 reps Knee Flexion: AROM;Left;Seated(x3 reps with 15 second hold at end range) General Exercises - Lower Extremity Hip Flexion/Marching: AROM;Strengthening;Both;Seated(x12 reps)    General Comments General comments (skin integrity, edema, etc.): dressing and hemovac in place L LE.  Pt agreeable to PT session.      Pertinent Vitals/Pain Pain Assessment: 0-10 Pain Score: 2  Pain Location: L knee Pain Descriptors /  Indicators: Aching;Sore Pain Intervention(s): Limited activity within patient's tolerance;Monitored during session;Premedicated before session;Repositioned;Other (comment)(polar care applied and activated)     Home Living Family/patient expects to be discharged to:: Private residence Living Arrangements: Spouse/significant other Available Help at Discharge: Family Type of Home: House Home Access: Stairs to enter Entrance Stairs-Rails: Right;Left Home Layout: One level Home Equipment: Marine scientist - single point;Hand held shower head      Prior Function Level of Independence: Independent with assistive device(s)      Comments: Pt uses SPC in community PRN.  No h/o falls in past 12 months. Pt independent in ADL and IADL tasks. Was driving and working part time.   PT Goals (current goals can now be found in the care plan section) Acute Rehab PT Goals Patient Stated Goal: to go home PT Goal Formulation: With patient Time For Goal Achievement: 06/17/18 Potential to Achieve Goals: Good Progress towards PT goals: Progressing toward goals    Frequency    BID      PT Plan Current plan remains appropriate    Co-evaluation              AM-PAC PT "6 Clicks" Daily Activity  Outcome Measure  Difficulty turning over in bed (including adjusting bedclothes, sheets and blankets)?: A Little Difficulty moving from lying on back to sitting on the side of the bed? : Unable Difficulty sitting down on and standing up from a chair with arms (e.g., wheelchair, bedside commode, etc,.)?: Unable Help needed moving to and from a bed to chair (including a wheelchair)?: A Little Help needed walking in hospital room?: A Little Help needed climbing 3-5 steps with a railing? : A Little 6 Click Score: 14    End of Session Equipment Utilized During Treatment: Gait belt Activity Tolerance: Patient tolerated treatment well Patient left: in bed;with call bell/phone within reach;with bed alarm  set;with SCD's reapplied;Other (comment)(L heel elevated via bone foam; R heel elevated via towel roll; polar care in place and activated) Nurse Communication: Mobility status;Precautions;Weight bearing status PT Visit Diagnosis: Other abnormalities of gait and mobility (R26.89);Muscle weakness (generalized) (M62.81);Difficulty in walking, not elsewhere classified (R26.2);Pain Pain - Right/Left: Left Pain - part of body: Knee     Time: 1354-1420 PT Time Calculation (min) (ACUTE ONLY): 26 min  Charges:  $Gait Training: 8-22 mins $Therapeutic Activity: 8-22 mins                    Leitha Bleak, PT 06/04/18, 2:40 PM [phone]

## 2018-06-04 NOTE — Progress Notes (Signed)
Clinical Social Worker (CSW) received SNF consult. PT is recommending home health. RN case manager aware of above. Please reconsult if future social work needs arise. CSW signing off.   Bari Handshoe, LCSW [phone] 

## 2018-06-04 NOTE — Progress Notes (Signed)
Physical Therapy Treatment Patient Details Name: Jackson Nadeem Sr. MRN: [id number] DOB: 04/28/1951 Today's Date: 06/04/2018    History of Present Illness Pt is a 67 y.o. male s/p L TKA secondary degenerative arthrosis 06/03/18.  PMH includes htn, sleep apnea, h/o smoking, anxiety, skin CA, LBP, R broken wrist, cardiac cath x2, and thyroid surgery.    PT Comments    Pt able to progress to ambulating 70 feet with RW CGA.  Pain 2/10 L knee beginning of session and 3/10 end of session resting in chair.  L knee ROM 0-87 degrees AROM.  Able to perform L LE SLR independently.  Will continue to progress pt with strengthening, L knee ROM, and progressive ambulation per pt tolerance.    Follow Up Recommendations  Home health PT     Equipment Recommendations  Rolling walker with 5" wheels;3in1 (PT)    Recommendations for Other Services OT consult     Precautions / Restrictions Precautions Precautions: Knee;Fall Precaution Booklet Issued: Yes (comment) Required Braces or Orthoses: Knee Immobilizer - Left Knee Immobilizer - Left: Discontinue once straight leg raise with < 10 degree lag Restrictions Weight Bearing Restrictions: Yes LLE Weight Bearing: Weight bearing as tolerated    Mobility  Bed Mobility Overal bed mobility: Needs Assistance Bed Mobility: Supine to Sit     Supine to sit: Min guard;HOB elevated     General bed mobility comments: CGA for L LE; vc's for technique; use of bed rail  Transfers Overall transfer level: Needs assistance Equipment used: Rolling walker (2 wheeled) Transfers: Sit to/from Stand Sit to Stand: Min assist         General transfer comment: minimal assist to initiate stand from bed; initial vc's for UE and LE placement  Ambulation/Gait Ambulation/Gait assistance: Min guard Gait Distance (Feet): 70 Feet Assistive device: Rolling walker (2 wheeled)   Gait velocity: decreased   General Gait Details: partial step through gait pattern; mild  decreased stance time L LE; initial vc's to increase UE support through RW; intermittent vc's to stay closer to RW; steady with RW   Stairs             Wheelchair Mobility    Modified Rankin (Stroke Patients Only)       Balance Overall balance assessment: Needs assistance Sitting-balance support: No upper extremity supported;Feet supported Sitting balance-Leahy Scale: Good Sitting balance - Comments: steady sitting reaching within BOS   Standing balance support: No upper extremity supported Standing balance-Leahy Scale: Good Standing balance comment: steady standing reaching within BOS                            Cognition Arousal/Alertness: Awake/alert Behavior During Therapy: Anxious Overall Cognitive Status: Within Functional Limits for tasks assessed                                        Exercises Total Joint Exercises Quad Sets: AROM;Strengthening;Both;10 reps;Supine Short Arc Quad: AAROM;Strengthening;Left;10 reps;Supine Heel Slides: AAROM;Strengthening;Left;10 reps;Supine Hip ABduction/ADduction: AAROM;Strengthening;Left;10 reps;Supine Straight Leg Raises: AROM;Strengthening;Left;10 reps;Supine Goniometric ROM: L knee 0-87 degrees AROM    General Comments General comments (skin integrity, edema, etc.): dressing and hemovac in place L LE.  Pt agreeable to PT session.      Pertinent Vitals/Pain Pain Assessment: 0-10 Pain Score: 3  Pain Location: L knee Pain Descriptors / Indicators: Sore;Tender Pain Intervention(s): Limited activity within  patient's tolerance;Monitored during session;Premedicated before session;Repositioned;Other (comment)(polar care applied and activated)    Home Living                      Prior Function            PT Goals (current goals can now be found in the care plan section) Acute Rehab PT Goals Patient Stated Goal: to go home PT Goal Formulation: With patient Time For Goal Achievement:  06/17/18 Potential to Achieve Goals: Good Progress towards PT goals: Progressing toward goals    Frequency    BID      PT Plan Current plan remains appropriate    Co-evaluation              AM-PAC PT "6 Clicks" Daily Activity  Outcome Measure  Difficulty turning over in bed (including adjusting bedclothes, sheets and blankets)?: A Little Difficulty moving from lying on back to sitting on the side of the bed? : Unable Difficulty sitting down on and standing up from a chair with arms (e.g., wheelchair, bedside commode, etc,.)?: Unable Help needed moving to and from a bed to chair (including a wheelchair)?: A Little Help needed walking in hospital room?: A Little Help needed climbing 3-5 steps with a railing? : A Little 6 Click Score: 14    End of Session Equipment Utilized During Treatment: Gait belt Activity Tolerance: Patient tolerated treatment well Patient left: in chair;with call bell/phone within reach;with chair alarm set;with SCD's reapplied;Other (comment)(B heels elevated via towel rolls; polar care in place and activated) Nurse Communication: Mobility status;Precautions;Weight bearing status PT Visit Diagnosis: Other abnormalities of gait and mobility (R26.89);Muscle weakness (generalized) (M62.81);Difficulty in walking, not elsewhere classified (R26.2);Pain Pain - Right/Left: Left Pain - part of body: Knee     Time: 2549-8264 PT Time Calculation (min) (ACUTE ONLY): 25 min  Charges:  $Gait Training: 8-22 mins $Therapeutic Exercise: 8-22 mins                    Leitha Bleak, PT 06/04/18, 9:19 AM [phone]

## 2018-06-04 NOTE — NC FL2 (Signed)
[place] LEVEL OF CARE SCREENING TOOL     IDENTIFICATION  Patient Name: Jackson Law Sr. Birthdate: 22-Apr-1951 Sex: male Admission Date (Current Location): 06/03/2018  [place] and [place] Number:  Engineering geologist and Address:  [facility], [address], [place], [place] [address]      Provider Number: [id number]  Attending Physician Name and Address:  Dereck Leep, MD  Relative Name and Phone Number:       Current Level of Care: Hospital Recommended Level of Care: [place] Prior Approval Number:    Date Approved/Denied:   PASRR Number: [id number])  Discharge Plan: SNF    Current Diagnoses: Patient Active Problem List   Diagnosis Date Noted  . Total knee replacement status 06/03/2018  . Primary osteoarthritis of left knee 04/14/2018  . Degenerative disc disease, lumbar 12/31/2017  . Numbness in feet 11/07/2017  . Neuropathy 10/29/2017  . Meniere's disease 04/24/2017  . OSA on CPAP 09/28/2015  . Hypertension 04/08/2014  . Sleep apnea 11/27/2008    Orientation RESPIRATION BLADDER Height & Weight     Self, Situation, Time, Place  Normal Continent Weight:   Height:     BEHAVIORAL SYMPTOMS/MOOD NEUROLOGICAL BOWEL NUTRITION STATUS      Continent Diet(Diet: Regular )  AMBULATORY STATUS COMMUNICATION OF NEEDS Skin   Extensive Assist Verbally Surgical wounds(Incision: Left Knee. )                       Personal Care Assistance Level of Assistance  Bathing, Feeding, Dressing Bathing Assistance: Limited assistance Feeding assistance: Independent Dressing Assistance: Limited assistance     Functional Limitations Info  Sight, Hearing, Speech Sight Info: Adequate Hearing Info: Adequate Speech Info: Adequate    SPECIAL CARE FACTORS FREQUENCY  PT (By licensed PT), OT (By licensed OT)     PT Frequency: (5) OT Frequency: (5)            Contractures      Additional Factors Info   Code Status, Allergies Code Status Info: (Full Code. ) Allergies Info: (Meperidine, Doxycycline, Penicillins)           Current Medications (06/04/2018):  This is the current hospital active medication list Current Facility-Administered Medications  Medication Dose Route Frequency Provider Last Rate Last Dose  . 0.9 %  sodium chloride infusion   Intravenous Continuous Hooten, Laurice Record, MD   Stopped at 06/03/18 1514  . acetaminophen (TYLENOL) tablet 325-650 mg  325-650 mg Oral Q6H PRN Hooten, Laurice Record, MD      . ALPRAZolam Duanne Moron) tablet 0.5 mg  0.5 mg Oral QHS PRN Hooten, Laurice Record, MD      . alum & mag hydroxide-simeth (MAALOX/MYLANTA) 200-200-20 MG/5ML suspension 30 mL  30 mL Oral Q4H PRN Hooten, Laurice Record, MD      . benazepril (LOTENSIN) tablet 20 mg  20 mg Oral Daily Hooten, Laurice Record, MD   Stopped at 06/03/18 1437   And  . amLODipine (NORVASC) tablet 5 mg  5 mg Oral Daily Hooten, Laurice Record, MD   5 mg at 06/04/18 0804  . bisacodyl (DULCOLAX) suppository 10 mg  10 mg Rectal Daily PRN Hooten, Laurice Record, MD      . celecoxib (CELEBREX) capsule 200 mg  200 mg Oral BID Dereck Leep, MD   200 mg at 06/04/18 0804  . clindamycin (CLEOCIN) IVPB 600 mg  600 mg Intravenous Q6H Hooten, Laurice Record, MD 100 mL/hr at  06/04/18 0141 600 mg at 06/04/18 0141  . diphenhydrAMINE (BENADRYL) 12.5 MG/5ML elixir 12.5-25 mg  12.5-25 mg Oral Q4H PRN Hooten, Laurice Record, MD      . enoxaparin (LOVENOX) injection 30 mg  30 mg Subcutaneous Q12H Hooten, Laurice Record, MD   30 mg at 06/04/18 0802  . ferrous sulfate tablet 325 mg  325 mg Oral BID WC Dereck Leep, MD   325 mg at 06/04/18 0803  . finasteride (PROSCAR) tablet 5 mg  5 mg Oral Daily Hooten, Laurice Record, MD   5 mg at 06/03/18 1421  . fluticasone (FLONASE) 50 MCG/ACT nasal spray 2 spray  2 spray Each Nare QHS PRN Hooten, Laurice Record, MD      . gabapentin (NEURONTIN) capsule 100 mg  100 mg Oral BID Dereck Leep, MD   100 mg at 06/04/18 0804  . gabapentin (NEURONTIN) capsule 300 mg   300 mg Oral QHS Hooten, Laurice Record, MD      . HYDROmorphone (DILAUDID) injection 0.5-1 mg  0.5-1 mg Intravenous Q4H PRN Hooten, Laurice Record, MD      . magnesium hydroxide (MILK OF MAGNESIA) suspension 30 mL  30 mL Oral Daily Hooten, Laurice Record, MD   30 mL at 06/04/18 0805  . menthol-cetylpyridinium (CEPACOL) lozenge 3 mg  1 lozenge Oral PRN Hooten, Laurice Record, MD       Or  . phenol (CHLORASEPTIC) mouth spray 1 spray  1 spray Mouth/Throat PRN Hooten, Laurice Record, MD      . metoCLOPramide (REGLAN) tablet 5-10 mg  5-10 mg Oral Q8H PRN Hooten, Laurice Record, MD       Or  . metoCLOPramide (REGLAN) injection 5-10 mg  5-10 mg Intravenous Q8H PRN Hooten, Laurice Record, MD      . metoCLOPramide (REGLAN) tablet 10 mg  10 mg Oral TID AC & HS Hooten, Laurice Record, MD   10 mg at 06/04/18 0805  . multivitamin with minerals tablet 1 tablet  1 tablet Oral Daily Hooten, Laurice Record, MD   1 tablet at 06/04/18 0803  . ondansetron (ZOFRAN) tablet 4 mg  4 mg Oral Q6H PRN Hooten, Laurice Record, MD       Or  . ondansetron (ZOFRAN) injection 4 mg  4 mg Intravenous Q6H PRN Hooten, Laurice Record, MD      . oxyCODONE (Oxy IR/ROXICODONE) immediate release tablet 10 mg  10 mg Oral Q4H PRN Dereck Leep, MD   10 mg at 06/03/18 2056  . oxyCODONE (Oxy IR/ROXICODONE) immediate release tablet 5 mg  5 mg Oral Q4H PRN Hooten, Laurice Record, MD   5 mg at 06/03/18 1422  . pantoprazole (PROTONIX) EC tablet 40 mg  40 mg Oral BID Dereck Leep, MD   40 mg at 06/04/18 0804  . polycarbophil (FIBERCON) tablet 625 mg  625 mg Oral Daily Dereck Leep, MD   625 mg at 06/03/18 1811  . polyvinyl alcohol (LIQUIFILM TEARS) 1.4 % ophthalmic solution 1 drop  1 drop Both Eyes PRN Hooten, Laurice Record, MD      . senna-docusate (Senokot-S) tablet 1 tablet  1 tablet Oral BID Dereck Leep, MD   1 tablet at 06/04/18 0803  . sodium phosphate (FLEET) 7-19 GM/118ML enema 1 enema  1 enema Rectal Once PRN Hooten, Laurice Record, MD      . traMADol Veatrice Bourbon) tablet 50-100 mg  50-100 mg Oral Q4H PRN Dereck Leep, MD    100 mg at 06/04/18 0804  .  triamcinolone cream (KENALOG) 0.1 % 1 application  1 application Topical Daily PRN Hooten, Laurice Record, MD         Discharge Medications: Please see discharge summary for a list of discharge medications.  Relevant Imaging Results:  Relevant Lab Results:   Additional Information (SSN: [id number])  Nehemiah Montee, Veronia Beets, LCSW

## 2018-06-04 NOTE — Consult Note (Signed)
[facility] NAME: Jackson Jordan    MR#:  [id number]  DATE OF BIRTH:  [birth year]  DATE OF ADMISSION:  06/03/2018  PRIMARY CARE PHYSICIAN: Idelle Crouch, MD   REQUESTING/REFERRING PHYSICIAN: Hooten  CHIEF COMPLAINT:  No chief complaint on file.   HISTORY OF PRESENT ILLNESS: Jackson Jordan  is a 67 y.o. male with a known history of anxiety, arthritis, skin cancer, hypertension, prediabetes, prostate enlargement, sleep apnea-admitted under orthopedic service for left knee replacement surgery which was done yesterday.  Today while sitting in the chair had complain of shortness of breath and also noted tachycardic with that. Patient's oxygen saturation was stable, his blood pressure was stable.  An EKG is unchanged. Because of this complaint, orthopedic surgeon consulted medical team.  PAST MEDICAL HISTORY:   Past Medical History:  Diagnosis Date  . Anxiety   . Arthritis   . Cancer ([place])    skin  . Hypertension   . Lower back pain   . Pre-diabetes   . Prostate enlargement   . Sleep apnea     PAST SURGICAL HISTORY:  Past Surgical History:  Procedure Laterality Date  . broken wrist Right   . CARDIAC CATHETERIZATION     x 2  . HERNIA REPAIR    . KNEE ARTHROPLASTY Left 06/03/2018   Procedure: COMPUTER ASSISTED TOTAL KNEE ARTHROPLASTY;  Surgeon: Dereck Leep, MD;  Location: [facility] ORS;  Service: Orthopedics;  Laterality: Left;  . SEPTOPLASTY    . SKIN CANCER EXCISION    . THYROID SURGERY      SOCIAL HISTORY:  Social History   Tobacco Use  . Smoking status: Former Smoker    Last attempt to quit: 05/23/1979    Years since quitting: 39.0  . Smokeless tobacco: Never Used  Substance Use Topics  . Alcohol use: Never    Frequency: Never    FAMILY HISTORY: History reviewed. No pertinent family history.  DRUG ALLERGIES:  Allergies  Allergen Reactions  . Meperidine Other (See Comments)    "I felt like I was leaving  this world." Confusion  . Doxycycline Other (See Comments)    Difficulty sleeping, felt like his heart was beating in his stomach  . Penicillins Itching, Rash and Other (See Comments)    Has patient had a PCN reaction causing immediate rash, facial/tongue/throat swelling, SOB or lightheadedness with hypotension: Unknown Has patient had a PCN reaction causing severe rash involving mucus membranes or skin necrosis: No Has patient had a PCN reaction that required hospitalization: Yes Has patient had a PCN reaction occurring within the last 10 years: No If all of the above answers are "NO", then may proceed with Cephalosporin use.     REVIEW OF SYSTEMS:   CONSTITUTIONAL: No fever, fatigue or weakness.  EYES: No blurred or double vision.  EARS, NOSE, AND THROAT: No tinnitus or ear pain.  RESPIRATORY: No cough,have shortness of breath,no wheezing or hemoptysis.  CARDIOVASCULAR: No chest pain, orthopnea, edema.  GASTROINTESTINAL: No nausea, vomiting, diarrhea or abdominal pain.  GENITOURINARY: No dysuria, hematuria.  ENDOCRINE: No polyuria, nocturia,  HEMATOLOGY: No anemia, easy bruising or bleeding SKIN: No rash or lesion. MUSCULOSKELETAL: No joint pain or arthritis.   NEUROLOGIC: No tingling, numbness, weakness.  PSYCHIATRY: No anxiety or depression.   MEDICATIONS AT HOME:  Prior to Admission medications   Medication Sig Start Date End Date Taking? Authorizing Provider  ALPRAZolam Duanne Moron) 0.5 MG tablet Take 0.5 mg by mouth at  bedtime as needed for anxiety or sleep.  12/31/17  Yes [provider]  amLODipine-benazepril (LOTREL) 5-20 MG capsule Take 1 capsule by mouth daily.   Yes [provider]  aspirin (ECOTRIN LOW STRENGTH) 81 MG EC tablet Take 81 mg by mouth daily. 11/28/08  Yes [provider]  finasteride (PROSCAR) 5 MG tablet Take 5 mg by mouth daily. 04/30/18  Yes [provider]  gabapentin (NEURONTIN) 100 MG capsule Take 100 mg by mouth 2 (two)  times daily. 03/15/18  Yes [provider]  ibuprofen (ADVIL,MOTRIN) 200 MG tablet Take 200 mg by mouth every 6 (six) hours as needed for headache or moderate pain.   Yes [provider]  Multiple Vitamin (MULTI-VITAMINS) TABS Take 1 tablet by mouth daily.   Yes [provider]  psyllium (REGULOID) 0.52 g capsule Take 0.52 g by mouth daily.   Yes [provider]  saw palmetto 500 MG capsule Take 500 mg by mouth daily.   Yes [provider]  triamcinolone cream (KENALOG) 0.1 % Apply 1 application topically daily as needed (for itching).   Yes [provider]  fluticasone (FLONASE) 50 MCG/ACT nasal spray Place 2 sprays into both nostrils at bedtime as needed for allergies or rhinitis.    [provider]  Polyvinyl Alcohol (LUBRICANT DROPS OP) Apply to eye as needed.    [provider]      PHYSICAL EXAMINATION:   VITAL SIGNS: Blood pressure 138/79, pulse (!) 103, temperature (!) 97.5 F (36.4 C), temperature source Oral, resp. rate 18, SpO2 93 %.  GENERAL:  68 y.o.-year-old patient lying in the bed with no acute distress.  EYES: Pupils equal, round, reactive to light and accommodation. No scleral icterus. Extraocular muscles intact.  HEENT: Head atraumatic, normocephalic. Oropharynx and nasopharynx clear.  NECK:  Supple, no jugular venous distention. No thyroid enlargement, no tenderness.  LUNGS: Normal breath sounds bilaterally, no wheezing, rales,rhonchi or crepitation. No use of accessory muscles of respiration.  CARDIOVASCULAR: S1, S2 fast at 100. No murmurs, rubs, or gallops.  ABDOMEN: Soft, nontender, nondistended. Bowel sounds present. No organomegaly or mass.  EXTREMITIES: No pedal edema, cyanosis, or clubbing.  Left leg postsurgical dressing on knee. NEUROLOGIC: Cranial nerves II through XII are intact. Muscle strength 5/5 in all extremities. Sensation intact. Gait not checked.  PSYCHIATRIC: The patient is alert  and oriented x 3.  SKIN: No obvious rash, lesion, or ulcer.   LABORATORY PANEL:   CBC No results for input(s): WBC, HGB, HCT, PLT, MCV, MCH, MCHC, RDW, LYMPHSABS, MONOABS, EOSABS, BASOSABS, BANDABS in the last 168 hours.  Invalid input(s): NEUTRABS, BANDSABD ------------------------------------------------------------------------------------------------------------------  Chemistries  No results for input(s): NA, K, CL, CO2, GLUCOSE, BUN, CREATININE, CALCIUM, MG, AST, ALT, ALKPHOS, BILITOT in the last 168 hours.  Invalid input(s): GFRCGP ------------------------------------------------------------------------------------------------------------------ estimated creatinine clearance is 96.2 mL/min (by C-G formula based on SCr of 0.97 mg/dL). ------------------------------------------------------------------------------------------------------------------ No results for input(s): TSH, T4TOTAL, T3FREE, THYROIDAB in the last 72 hours.  Invalid input(s): FREET3   Coagulation profile No results for input(s): INR, PROTIME in the last 168 hours. ------------------------------------------------------------------------------------------------------------------- No results for input(s): DDIMER in the last 72 hours. -------------------------------------------------------------------------------------------------------------------  Cardiac Enzymes No results for input(s): CKMB, TROPONINI, MYOGLOBIN in the last 168 hours.  Invalid input(s): CK ------------------------------------------------------------------------------------------------------------------ Invalid input(s): POCBNP  ---------------------------------------------------------------------------------------------------------------  Urinalysis    Component Value Date/Time   COLORURINE AMBER (A) 05/22/2018 1029   APPEARANCEUR TURBID (A) 05/22/2018 1029   LABSPEC 1.017 05/22/2018 1029   PHURINE 5.0  05/22/2018 [place] 05/22/2018 1029   HGBUR NEGATIVE 05/22/2018 1029   BILIRUBINUR NEGATIVE 05/22/2018 1029   KETONESUR NEGATIVE 05/22/2018 1029   PROTEINUR NEGATIVE 05/22/2018 1029   NITRITE NEGATIVE 05/22/2018 1029   LEUKOCYTESUR NEGATIVE 05/22/2018 1029     RADIOLOGY: Dg Chest 2 View  Result Date: 06/04/2018 CLINICAL DATA:  Chills and shortness of breath today EXAM: CHEST - 2 VIEW COMPARISON:  CT chest 01/08/2018 FINDINGS: Tracheal deviation LEFT-to-RIGHT due to LEFT thyroid enlargement extending substernal on prior CT. Otherwise normal heart size, mediastinal contours, and pulmonary vascularity. Lungs clear. No infiltrate, pleural effusion or pneumothorax. Pulmonary nodules identified on the prior CT exam are not definitely visualized radiographically. No acute osseous findings. Scattered endplate spur formation thoracic spine. IMPRESSION: No acute abnormalities. 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 Electronically Signed   By: Lavonia Dana M.D.   On: 06/04/2018 15:29   Dg Knee Left Port  Result Date: 06/03/2018 CLINICAL DATA:  Total knee replacement EXAM: PORTABLE LEFT KNEE - 1-2 VIEW COMPARISON:  None. FINDINGS: Total knee arthroplasty with well-seated prosthesis. No subluxation or fracture. IMPRESSION: Total knee arthroplasty without complicating feature. Electronically Signed   By: Monte Fantasia M.D.   On: 06/03/2018 11:09    EKG: Orders placed or performed during the hospital encounter of 06/03/18  . EKG 12-Lead  . EKG 12-Lead    IMPRESSION AND PLAN:  *Tachycardia Shortness of breath Anxiety.  Patient's chest x-ray is clear, oxygen saturation is satisfactory.  Blood pressure is normal. EKG reviewed having left bundle branch block which is old.  Patient stated he had cardiac catheterization done long time ago when they found left bundle branch block but it always was negative. Likely this is anxiety episode I will check troponin one time and  TSH to rule out other pathologies. We will give Xanax orally as needed. As his oxygen saturation is stable, I do not think this is pulmonary embolism and no need for further work-up in that direction.  *Left knee replacement Management per primary team. Pain management and DVT prophylaxis by Lovenox.  *Hypertension Continue home medications.  *Goiter On chest x-ray there is some deviation on his trachea We will check his TSH.  All the records are reviewed and case discussed with ED provider. Management plans discussed with the patient, family and they are in agreement.  CODE STATUS: full.     Code Status Orders  (From admission, onward)         Start     Ordered   06/03/18 1332  Full code  Continuous     06/03/18 1331        Code Status History    This patient has a current code status but no historical code status.       TOTAL TIME TAKING CARE OF THIS PATIENT: 45 minutes.    Vaughan Basta M.D on 06/04/2018   Between 7am to 6pm - Pager - [phone]  After 6pm go to [email or web address] - password [email or web address] [facility]  Office  [phone]  CC: Primary care physician; Idelle Crouch, MD   Note: This dictation was prepared with Dragon dictation along with smaller phrase technology. Any transcriptional errors that result from this process are unintentional.

## 2018-06-04 NOTE — Progress Notes (Signed)
  Subjective: 1 Day Post-Op Procedure(s) (LRB): COMPUTER ASSISTED TOTAL KNEE ARTHROPLASTY (Left) Patient reports pain as mild.   Patient seen in rounds with Dr. Marry Guan. Patient is well, and has had no acute complaints or problems Plan is to go Home after hospital stay. Negative for chest pain and shortness of breath Fever: no Gastrointestinal: Negative for nausea and vomiting  Objective: Vital signs in last 24 hours: Temp:  [97.2 F (36.2 C)-98.2 F (36.8 C)] 98.2 F (36.8 C) (11/19 0005) Pulse Rate:  [78-102] 82 (11/19 0005) Resp:  [13-20] 20 (11/19 0005) BP: (104-143)/(52-92) 106/61 (11/19 0005) SpO2:  [94 %-98 %] 95 % (11/19 0005)  Intake/Output from previous day:  Intake/Output Summary (Last 24 hours) at 06/04/2018 0622 Last data filed at 06/04/2018 0500 Gross per 24 hour  Intake 2282.19 ml  Output 2945 ml  Net -662.81 ml    Intake/Output this shift: Total I/O In: 796.6 [IV Piggyback:796.6] Out: 850 [Urine:850]  Labs: No results for input(s): HGB in the last 72 hours. No results for input(s): WBC, RBC, HCT, PLT in the last 72 hours. No results for input(s): NA, K, CL, CO2, BUN, CREATININE, GLUCOSE, CALCIUM in the last 72 hours. No results for input(s): LABPT, INR in the last 72 hours.   EXAM General - Patient is Alert and Oriented Extremity - Sensation intact distally Dorsiflexion/Plantar flexion intact Compartment soft Dressing/Incision - clean, dry, no drainage, with the Hemovac intact Motor Function - intact, moving foot and toes well on exam.  Able to straight leg raise independently.  Past Medical History:  Diagnosis Date  . Anxiety   . Arthritis   . Cancer ([place])    skin  . Hypertension   . Lower back pain   . Pre-diabetes   . Prostate enlargement   . Sleep apnea     Assessment/Plan: 1 Day Post-Op Procedure(s) (LRB): COMPUTER ASSISTED TOTAL KNEE ARTHROPLASTY (Left) Active Problems:   Total knee replacement status  Estimated body mass  index is 36.96 kg/m as calculated from the following:   Height as of 05/22/18: 5\' 11"  (1.803 m).   Weight as of 05/22/18: 120.2 kg. Advance diet Up with therapy D/C IV fluids Plan for discharge tomorrow  DVT Prophylaxis - Lovenox, Foot Pumps and TED hose Weight-Bearing as tolerated to left leg  Reche Dixon, PA-C Orthopaedic Surgery 06/04/2018, 6:22 AM

## 2018-06-05 ENCOUNTER — Inpatient Hospital Stay: Payer: Medicare HMO

## 2018-06-05 LAB — CBC
HCT: 36.7 % — ABNORMAL LOW (ref 39.0–52.0)
Hemoglobin: 12.3 g/dL — ABNORMAL LOW (ref 13.0–17.0)
MCH: 29.6 pg (ref 26.0–34.0)
MCHC: 33.5 g/dL (ref 30.0–36.0)
MCV: 88.2 fL (ref 80.0–100.0)
Platelets: 220 10*3/uL (ref 150–400)
RBC: 4.16 MIL/uL — ABNORMAL LOW (ref 4.22–5.81)
RDW: 12.7 % (ref 11.5–15.5)
WBC: 11.3 10*3/uL — ABNORMAL HIGH (ref 4.0–10.5)
nRBC: 0 % (ref 0.0–0.2)

## 2018-06-05 LAB — BASIC METABOLIC PANEL
ANION GAP: 9 (ref 5–15)
BUN: 12 mg/dL (ref 8–23)
CO2: 25 mmol/L (ref 22–32)
Calcium: 8.2 mg/dL — ABNORMAL LOW (ref 8.9–10.3)
Chloride: 106 mmol/L (ref 98–111)
Creatinine, Ser: 0.9 mg/dL (ref 0.61–1.24)
GFR calc Af Amer: >60 mL/min (ref >60)
Glucose, Bld: 112 mg/dL — ABNORMAL HIGH (ref 70–99)
POTASSIUM: 4 mmol/L (ref 3.5–5.1)
SODIUM: 140 mmol/L (ref 135–145)

## 2018-06-05 MED ORDER — TRAMADOL HCL 50 MG PO TABS: 50.0000 mg | ORAL_TABLET | ORAL | 0 refills | Status: DC | PRN

## 2018-06-05 MED ORDER — IOHEXOL 350 MG/ML SOLN
75.0000 mL | Freq: Once | INTRAVENOUS | Status: AC | PRN
  Administered 2018-06-05: 75 mL via INTRAVENOUS

## 2018-06-05 MED ORDER — ENOXAPARIN SODIUM 40 MG/0.4ML ~~LOC~~ SOLN: 40.0000 mg | SUBCUTANEOUS | 0 refills | Status: DC

## 2018-06-05 MED ORDER — METOPROLOL TARTRATE 25 MG PO TABS: 25.0000 mg | ORAL_TABLET | Freq: Two times a day (BID) | ORAL | 0 refills | Status: DC

## 2018-06-05 MED ORDER — OXYCODONE HCL 5 MG PO TABS: 5.0000 mg | ORAL_TABLET | ORAL | 0 refills | Status: DC | PRN

## 2018-06-05 MED ORDER — METOPROLOL TARTRATE 25 MG PO TABS
25.0000 mg | ORAL_TABLET | Freq: Two times a day (BID) | ORAL | Status: DC
  Administered 2018-06-05: 25 mg via ORAL
  Filled 2018-06-05: qty 1

## 2018-06-05 NOTE — Care Management (Signed)
RNCM met with patient and his family to see if he has any other questions. He states he feels that he is having a little trouble breathing and relates it to not being about to have a BM.  He states he believes it is anxiety.  He is not struggling to breathe and he is not in any distress.  I encouraged incentive spirometer, water intake, and mobility. RNCM will follow.

## 2018-06-05 NOTE — Progress Notes (Signed)
[place] [facility] [place] NAME: Jackson Jordan    MR#:  [id number]  DATE OF BIRTH:  11-Feb-1951  SUBJECTIVE:  CHIEF COMPLAINT:  No chief complaint on file.  - complains of dyspnea, and left knee pain - doing well otherwise  REVIEW OF SYSTEMS:  Review of Systems  Constitutional: Negative for chills, fever and malaise/fatigue.  HENT: Negative for ear discharge, hearing loss and nosebleeds.   Eyes: Negative for blurred vision and double vision.  Respiratory: Positive for shortness of breath. Negative for cough and wheezing.   Cardiovascular: Negative for chest pain, palpitations and leg swelling.  Gastrointestinal: Negative for abdominal pain, constipation, diarrhea, nausea and vomiting.  Genitourinary: Negative for dysuria.  Musculoskeletal: Positive for joint pain. Negative for myalgias.  Neurological: Negative for dizziness, focal weakness, seizures, weakness and headaches.  Psychiatric/Behavioral: Negative for depression.    DRUG ALLERGIES:   Allergies  Allergen Reactions  . Meperidine Other (See Comments)    "I felt like I was leaving this world." Confusion  . Doxycycline Other (See Comments)    Difficulty sleeping, felt like his heart was beating in his stomach  . Penicillins Itching, Rash and Other (See Comments)    Has patient had a PCN reaction causing immediate rash, facial/tongue/throat swelling, SOB or lightheadedness with hypotension: Unknown Has patient had a PCN reaction causing severe rash involving mucus membranes or skin necrosis: No Has patient had a PCN reaction that required hospitalization: Yes Has patient had a PCN reaction occurring within the last 10 years: No If all of the above answers are "NO", then may proceed with Cephalosporin use.     VITALS:  Blood pressure (!) 152/86, pulse 100, temperature 98.7 F (37.1 C), temperature source Oral, resp. rate 18, height 5\' 11"  (1.803 m), SpO2 93 %.  PHYSICAL  EXAMINATION:  Physical Exam  GENERAL:  67 y.o.-year-old patient lying in the bed with no acute distress.  EYES: Pupils equal, round, reactive to light and accommodation. No scleral icterus. Extraocular muscles intact.  HEENT: Head atraumatic, normocephalic. Oropharynx and nasopharynx clear.  NECK:  Supple, no jugular venous distention. No thyroid enlargement, no tenderness.  LUNGS: Normal breath sounds bilaterally, no wheezing, rales,rhonchi or crepitation. No use of accessory muscles of respiration. Decreased bibasilar breath sounds CARDIOVASCULAR: S1, S2 normal. No  rubs, or gallops. 2/6 systolic murmur present ABDOMEN: Soft, nontender, nondistended. Bowel sounds present. No organomegaly or mass.  EXTREMITIES: left knee immobilizer, hemovac in place. - No pedal edema, cyanosis, or clubbing.  NEUROLOGIC: Cranial nerves II through XII are intact. Muscle strength 5/5 in all extremities. Sensation intact. Gait not checked.  PSYCHIATRIC: The patient is alert and oriented x 3.  SKIN: No obvious rash, lesion, or ulcer.    LABORATORY PANEL:   CBC Recent Labs  Lab 06/05/18 0344  WBC 11.3*  HGB 12.3*  HCT 36.7*  PLT 220   ------------------------------------------------------------------------------------------------------------------  Chemistries  Recent Labs  Lab 06/05/18 0344  NA 140  K 4.0  CL 106  CO2 25  GLUCOSE 112*  BUN 12  CREATININE 0.90  CALCIUM 8.2*   ------------------------------------------------------------------------------------------------------------------  Cardiac Enzymes Recent Labs  Lab 06/04/18 1725  TROPONINI <0.03   ------------------------------------------------------------------------------------------------------------------  RADIOLOGY:  Dg Chest 2 View  Result Date: 06/04/2018 CLINICAL DATA:  Chills and shortness of breath today EXAM: CHEST - 2 VIEW COMPARISON:  CT chest 01/08/2018 FINDINGS: Tracheal deviation LEFT-to-RIGHT due to LEFT  thyroid enlargement extending substernal on prior CT. Otherwise normal heart size,  mediastinal contours, and pulmonary vascularity. Lungs clear. No infiltrate, pleural effusion or pneumothorax. Pulmonary nodules identified on the prior CT exam are not definitely visualized radiographically. No acute osseous findings. Scattered endplate spur formation thoracic spine. IMPRESSION: No acute abnormalities. 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 Electronically Signed   By: Lavonia Dana M.D.   On: 06/04/2018 15:29    EKG:   Orders placed or performed during the hospital encounter of 06/03/18  . EKG 12-Lead  . EKG 12-Lead    ASSESSMENT AND PLAN:   67 year old male with past medical history significant for hypertension, sleep apnea, arthritis, anxiety presents to hospital for left knee replacement surgery.  Medical consult requested for his dyspnea and tachycardia.  1.  Dyspnea and tachycardia-agree with anxiety -However he was tachycardic on movement though not hypoxic -We will get CT Angio to rule out pulmonary embolism  2.  Hypertension-on benazepril, Norvasc.  Added low-dose metoprolol  3.  GERD-Protonix  4.  Left total knee arthroplasty-management per orthopedics.  Postop day 2 today -Continue pain control. -DVT prophylaxis with Lovenox -Physical therapy consulted.  Possible discharge with home health today  5.  DVT prophylaxis-Lovenox  Medically stable otherwise, need to follow-up on the CT Angio prior to discharge   All the records are reviewed and case discussed with Care Management/Social Workerr. Management plans discussed with the patient, family and they are in agreement.  CODE STATUS: Full Code  TOTAL TIME TAKING CARE OF THIS PATIENT: 38 minutes.   POSSIBLE D/C IN 1 DAY, DEPENDING ON CLINICAL CONDITION.   Gladstone Lighter M.D on 06/05/2018 at 12:20 PM  Between 7am to 6pm - Pager - [phone]  After 6pm go to [email or web address] -  password [email or web address] [facility]  Office  [phone]  CC: Primary care physician; Idelle Crouch, MD

## 2018-06-05 NOTE — Discharge Summary (Signed)
Physician Discharge Summary  Patient ID: Jackson Powell Sr. MRN: [id number] DOB/AGE: [birth year] 67 y.o.  Admit date: 06/03/2018 Discharge date: 06/05/2018  Admission Diagnoses:  PRIMARY OSTEOARTHRITIS OF LEFT KNEE   Discharge Diagnoses: Patient Active Problem List   Diagnosis Date Noted  . Total knee replacement status 06/03/2018  . Primary osteoarthritis of left knee 04/14/2018  . Degenerative disc disease, lumbar 12/31/2017  . Numbness in feet 11/07/2017  . Neuropathy 10/29/2017  . Meniere's disease 04/24/2017  . OSA on CPAP 09/28/2015  . Hypertension 04/08/2014  . Sleep apnea 11/27/2008    Past Medical History:  Diagnosis Date  . Anxiety   . Arthritis   . Cancer ([place])    skin  . Hypertension   . Lower back pain   . Pre-diabetes   . Prostate enlargement   . Sleep apnea      Transfusion: No transfusions during this admission   Consultants (if any):   Discharged Condition: Improved  Hospital Course: Jackson Collard Sr. is an 67 y.o. male who was admitted 06/03/2018 with a diagnosis of degenerative arthrosis left knee and went to the operating room on 06/03/2018 and underwent the above named procedures.    Surgeries:Procedure(s): COMPUTER ASSISTED TOTAL KNEE ARTHROPLASTY on 06/03/2018  PRE-OPERATIVE DIAGNOSIS: Degenerative arthrosis of the left knee, primary  POST-OPERATIVE DIAGNOSIS:  Same  PROCEDURE:  Left total knee arthroplasty using computer-assisted navigation  SURGEON:  Marciano Sequin. M.D.  ASSISTANT:  Vance Peper, PA (present and scrubbed throughout the case, critical for assistance with exposure, retraction, instrumentation, and closure)  ANESTHESIA: spinal  ESTIMATED BLOOD LOSS: 50 mL  FLUIDS REPLACED: 1000 mL of crystalloid  TOURNIQUET TIME: 78 minutes  DRAINS: 2 medium Hemovac drains  SOFT TISSUE RELEASES: Anterior cruciate ligament, posterior cruciate ligament, deep and superficial medial collateral ligament, patellofemoral  ligament  IMPLANTS UTILIZED: DePuy Attune size 6 posterior stabilized femoral component (cemented), size 7 rotating platform tibial component (cemented), 38 mm medialized dome patella (cemented), and a 5 mm stabilized rotating platform polyethylene insert.  INDICATIONS FOR SURGERY: Jackson Macomber Sr. is a 67 y.o. year old male with a long history of progressive knee pain. X-rays demonstrated severe degenerative changes in tricompartmental fashion. The patient had not seen any significant improvement despite conservative nonsurgical intervention. After discussion of the risks and benefits of surgical intervention, the patient expressed understanding of the risks benefits and agree with plans for total knee arthroplasty.   The risks, benefits, and alternatives were discussed at length including but not limited to the risks of infection, bleeding, nerve injury, stiffness, blood clots, the need for revision surgery, cardiopulmonary complications, among others, and they were willing to proceed. Patient tolerated the surgery well. No complications .Patient was taken to PACU where she was stabilized and then transferred to the orthopedic floor.  The patient did have medical consult secondary to some tachycardia and dyspnea.  CT angios was ordered to rule out pulmonary embolus.  Patient started on Lovenox 30 mg q 12 hrs. Foot pumps applied bilaterally at 80 mm hgb. Heels elevated off bed with rolled towels. No evidence of DVT. Calves non tender. Negative Homan. Physical therapy started on day #1 for gait training and transfer with OT starting on  day #1 for ADL and assisted devices. Patient has done well with therapy. Ambulated greater than 200 feet upon being discharged.  Was able to ascend and descend 4 steps safely and independently  Patient's IV And Foley were discontinued on day #1 with Hemovac being discontinued on  day #2. Dressing was changed on day 2 prior to patient being discharged   He was  given perioperative antibiotics:  Anti-infectives (From admission, onward)   Start     Dose/Rate Route Frequency Ordered Stop   06/03/18 1400  clindamycin (CLEOCIN) IVPB 600 mg     600 mg 100 mL/hr over 30 Minutes Intravenous Every 6 hours 06/03/18 1331 06/04/18 1037   06/03/18 0617  clindamycin (CLEOCIN) 900 MG/50ML IVPB    Note to Pharmacy:  Jackson Jordan  : cabinet override      06/03/18 0617 06/03/18 0742   06/03/18 0600  clindamycin (CLEOCIN) IVPB 900 mg     900 mg 100 mL/hr over 30 Minutes Intravenous On call to O.R. 06/02/18 7858 06/03/18 0757    .  He was fitted with AV 1 compression foot pump devices, instructed on heel pumps, early ambulation, and fitted with TED stockings bilaterally for DVT prophylaxis.  He benefited maximally from the hospital stay and there were no complications.    Recent vital signs:  Vitals:   06/05/18 1040 06/05/18 1347  BP: (!) 152/86 118/76  Pulse: 100 93  Resp:  19  Temp:  98 F (36.7 C)  SpO2:  98%    Recent laboratory studies:  Lab Results  Component Value Date   HGB 12.3 (L) 06/05/2018   HGB 15.6 05/22/2018   Lab Results  Component Value Date   WBC 11.3 (H) 06/05/2018   PLT 220 06/05/2018   Lab Results  Component Value Date   INR 0.95 05/22/2018   Lab Results  Component Value Date   NA 140 06/05/2018   K 4.0 06/05/2018   CL 106 06/05/2018   CO2 25 06/05/2018   BUN 12 06/05/2018   CREATININE 0.90 06/05/2018   GLUCOSE 112 (H) 06/05/2018    Discharge Medications:   Allergies as of 06/05/2018      Reactions   Meperidine Other (See Comments)   "I felt like I was leaving this world." Confusion   Doxycycline Other (See Comments)   Difficulty sleeping, felt like his heart was beating in his stomach   Penicillins Itching, Rash, Other (See Comments)   Has patient had a PCN reaction causing immediate rash, facial/tongue/throat swelling, SOB or lightheadedness with hypotension: Unknown Has patient had a PCN reaction  causing severe rash involving mucus membranes or skin necrosis: No Has patient had a PCN reaction that required hospitalization: Yes Has patient had a PCN reaction occurring within the last 10 years: No If all of the above answers are "NO", then may proceed with Cephalosporin use.      Medication List    STOP taking these medications   ECOTRIN LOW STRENGTH 81 MG EC tablet Generic drug:  aspirin   ibuprofen 200 MG tablet Commonly known as:  ADVIL,MOTRIN     TAKE these medications   ALPRAZolam 0.5 MG tablet Commonly known as:  XANAX Take 0.5 mg by mouth at bedtime as needed for anxiety or sleep.   amLODipine-benazepril 5-20 MG capsule Commonly known as:  LOTREL Take 1 capsule by mouth daily.   enoxaparin 40 MG/0.4ML injection Commonly known as:  LOVENOX Inject 0.4 mLs (40 mg total) into the skin daily for 14 days. Start taking on:  06/06/2018   finasteride 5 MG tablet Commonly known as:  PROSCAR Take 5 mg by mouth daily.   fluticasone 50 MCG/ACT nasal spray Commonly known as:  FLONASE Place 2 sprays into both nostrils at bedtime as needed for allergies  or rhinitis.   gabapentin 100 MG capsule Commonly known as:  NEURONTIN Take 100 mg by mouth 2 (two) times daily.   LUBRICANT DROPS OP Apply to eye as needed.   MULTI-VITAMINS Tabs Take 1 tablet by mouth daily.   oxyCODONE 5 MG immediate release tablet Commonly known as:  Oxy IR/ROXICODONE Take 1 tablet (5 mg total) by mouth every 4 (four) hours as needed for moderate pain (pain score 4-6).   psyllium 0.52 g capsule Commonly known as:  REGULOID Take 0.52 g by mouth daily.   saw palmetto 500 MG capsule Take 500 mg by mouth daily.   traMADol 50 MG tablet Commonly known as:  ULTRAM Take 1-2 tablets (50-100 mg total) by mouth every 4 (four) hours as needed for moderate pain.   triamcinolone cream 0.1 % Commonly known as:  KENALOG Apply 1 application topically daily as needed (for itching).             Durable Medical Equipment  (From admission, onward)         Start     Ordered   06/03/18 1332  DME Walker rolling  Once    Question:  Patient needs a walker to treat with the following condition  Answer:  Total knee replacement status   06/03/18 1331   06/03/18 1332  DME Bedside commode  Once    Question:  Patient needs a bedside commode to treat with the following condition  Answer:  Total knee replacement status   06/03/18 1331          Diagnostic Studies: Dg Chest 2 View  Result Date: 06/04/2018 CLINICAL DATA:  Chills and shortness of breath today EXAM: CHEST - 2 VIEW COMPARISON:  CT chest 01/08/2018 FINDINGS: Tracheal deviation LEFT-to-RIGHT due to LEFT thyroid enlargement extending substernal on prior CT. Otherwise normal heart size, mediastinal contours, and pulmonary vascularity. Lungs clear. No infiltrate, pleural effusion or pneumothorax. Pulmonary nodules identified on the prior CT exam are not definitely visualized radiographically. No acute osseous findings. Scattered endplate spur formation thoracic spine. IMPRESSION: No acute abnormalities. 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 Electronically Signed   By: Lavonia Dana M.D.   On: 06/04/2018 15:29   Dg Knee Left Port  Result Date: 06/03/2018 CLINICAL DATA:  Total knee replacement EXAM: PORTABLE LEFT KNEE - 1-2 VIEW COMPARISON:  None. FINDINGS: Total knee arthroplasty with well-seated prosthesis. No subluxation or fracture. IMPRESSION: Total knee arthroplasty without complicating feature. Electronically Signed   By: Monte Fantasia M.D.   On: 06/03/2018 11:09    Disposition: Discharge disposition: 01-Home or Self Care       Discharge Instructions    Increase activity slowly   Complete by:  As directed       Follow-up Information    Watt Climes, PA On 06/18/2018.   Specialty:  Physician Assistant Why:  at 9:15am Contact information: [place] [address] [phone]        Dereck Leep, MD On 07/18/2018.   Specialty:  Orthopedic Surgery Why:  at 2:00pm Contact information: [place] [address] [phone]            Signed: Watt Climes 06/05/2018, 2:06 PM

## 2018-06-05 NOTE — Progress Notes (Signed)
Pt ready for discharge home this afternoon after CT results came back- neg per Dr. Tressia Miners. Ok to d/t  If negative per Vance Peper, PA. Pt anxious to go home, states he will follow up with PCP. Reviewed discharge instructions and prescriptions with pt and his wife; all questions answered and pt verbalized understanding. PIV removed, VSS. Pt gave lovenox injection this morning. Pt assisted to car via NT with all belongings.   Jackson Jordan

## 2018-06-05 NOTE — Progress Notes (Signed)
Pt reporting some SOB after PT. Pt waiting to be transported to radiology for chest CT, pt's VSS. Vance Peper PA on floor and made aware.

## 2018-06-05 NOTE — Progress Notes (Signed)
Physical Therapy Treatment Patient Details Name: Jackson Rentz Sr. MRN: [id number] DOB: [birth year] Today's Date: 06/05/2018    History of Present Illness Pt is a 66 y.o. male s/p L TKA secondary degenerative arthrosis 06/03/18.  PMH includes htn, sleep apnea, h/o smoking, anxiety, skin CA, LBP, R broken wrist, cardiac cath x2, and thyroid surgery.    PT Comments    Pt able to navigate 4 stairs with railing and ambulate around nursing loop with RW; pt steady and safe with functional mobility throughout therapy session.  Pain 0/10 L knee at rest beginning of session, 3-4/10 with activity, and 2/10 end of session resting in chair.  O2 sats 94% or greater on room air and HR 83-100 bpm during session's activities.  L knee ROM 0-95 degrees.  Pt and pt's wife report no question/concerns regarding functional mobility or HEP for discharge home.  Pt appears safe to discharge home with support of family when medically appropriate.   Follow Up Recommendations  Home health PT     Equipment Recommendations  Rolling walker with 5" wheels;3in1 (PT)    Recommendations for Other Services OT consult     Precautions / Restrictions Precautions Precautions: Knee;Fall Precaution Booklet Issued: Yes (comment) Required Braces or Orthoses: Knee Immobilizer - Left Knee Immobilizer - Left: Discontinue once straight leg raise with < 10 degree lag Restrictions Weight Bearing Restrictions: Yes LLE Weight Bearing: Weight bearing as tolerated    Mobility  Bed Mobility               General bed mobility comments: Deferred (pt up in chair beginning/end of session)  Transfers Overall transfer level: Needs assistance Equipment used: Rolling walker (2 wheeled) Transfers: Sit to/from Stand Sit to Stand: Supervision         General transfer comment: steady strong transfers from recliner and from [facility] over toilet  Ambulation/Gait Ambulation/Gait assistance: Supervision Gait Distance (Feet): 250  Feet Assistive device: Rolling walker (2 wheeled)   Gait velocity: decreased   General Gait Details: partial step through gait pattern; occasional vc's for longer L LE step length; mild decreased stance time L LE; steady with RW   Stairs Stairs: Yes Stairs assistance: Min guard Stair Management: One rail Left;Step to pattern;Sideways Number of Stairs: 4 General stair comments: initial vc's and then pt able to perform on own safely   Wheelchair Mobility    Modified Rankin (Stroke Patients Only)       Balance Overall balance assessment: Needs assistance Sitting-balance support: Feet supported;No upper extremity supported Sitting balance-Leahy Scale: Normal Sitting balance - Comments: steady sitting reaching outside BOS   Standing balance support: No upper extremity supported Standing balance-Leahy Scale: Good Standing balance comment: steady standing reaching within BOS and washing hands at sink                            Cognition Arousal/Alertness: Awake/alert Behavior During Therapy: WFL for tasks assessed/performed Overall Cognitive Status: Within Functional Limits for tasks assessed                                        Exercises Total Joint Exercises Goniometric ROM: L knee 0-95 degrees AROM    General Comments General comments (skin integrity, edema, etc.): dressing and hemovac in place L LE.  Nursing cleared pt for participation in physical therapy.  Pt agreeable to PT session.  Pertinent Vitals/Pain Pain Assessment: 0-10 Pain Score: 2  Pain Location: L knee Pain Descriptors / Indicators: Aching;Sore Pain Intervention(s): Limited activity within patient's tolerance;Monitored during session;Premedicated before session;Repositioned;Other (comment)(polar care applied and activated)    Home Living                      Prior Function            PT Goals (current goals can now be found in the care plan section)  Acute Rehab PT Goals Patient Stated Goal: to go home PT Goal Formulation: With patient Time For Goal Achievement: 06/17/18 Potential to Achieve Goals: Good Progress towards PT goals: Progressing toward goals    Frequency    BID      PT Plan Current plan remains appropriate    Co-evaluation              AM-PAC PT "6 Clicks" Daily Activity  Outcome Measure  Difficulty turning over in bed (including adjusting bedclothes, sheets and blankets)?: A Little Difficulty moving from lying on back to sitting on the side of the bed? : A Little Difficulty sitting down on and standing up from a chair with arms (e.g., wheelchair, bedside commode, etc,.)?: A Little Help needed moving to and from a bed to chair (including a wheelchair)?: A Little Help needed walking in hospital room?: A Little Help needed climbing 3-5 steps with a railing? : A Little 6 Click Score: 18    End of Session Equipment Utilized During Treatment: Gait belt Activity Tolerance: Patient tolerated treatment well Patient left: in chair;with call bell/phone within reach;with chair alarm set;with family/visitor present;with SCD's reapplied;Other (comment)(B heels elevated via towel rolls; polar care in place and activated) Nurse Communication: Mobility status;Precautions;Weight bearing status PT Visit Diagnosis: Other abnormalities of gait and mobility (R26.89);Muscle weakness (generalized) (M62.81);Difficulty in walking, not elsewhere classified (R26.2);Pain Pain - Right/Left: Left Pain - part of body: Knee     Time: 5643-3295 PT Time Calculation (min) (ACUTE ONLY): 38 min  Charges:  $Gait Training: 8-22 mins $Therapeutic Exercise: 8-22 mins $Therapeutic Activity: 8-22 mins                    Leitha Bleak, PT 06/05/18, 12:51 PM [phone]

## 2018-07-16 DIAGNOSIS — I420 Dilated cardiomyopathy: Secondary | ICD-10-CM | POA: Insufficient documentation

## 2018-07-16 DIAGNOSIS — I447 Left bundle-branch block, unspecified: Secondary | ICD-10-CM | POA: Insufficient documentation

## 2018-07-16 DIAGNOSIS — R000 Tachycardia, unspecified: Secondary | ICD-10-CM | POA: Insufficient documentation

## 2018-09-02 ENCOUNTER — Encounter: Payer: Self-pay | Admitting: *Deleted

## 2018-09-03 ENCOUNTER — Other Ambulatory Visit: Payer: Self-pay

## 2018-09-03 ENCOUNTER — Ambulatory Visit
Admission: RE | Admit: 2018-09-03 | Discharge: 2018-09-03 | Disposition: A | Discharge status: Home / Self Care | Payer: Medicare HMO | Attending: Ophthalmology | Admitting: Ophthalmology

## 2018-09-03 ENCOUNTER — Encounter: Payer: Self-pay | Admitting: Anesthesiology

## 2018-09-03 ENCOUNTER — Ambulatory Visit: Payer: Medicare HMO | Admitting: Anesthesiology

## 2018-09-03 ENCOUNTER — Encounter
Admission: RE | Disposition: A | Discharge status: Home / Self Care | Payer: Self-pay | Source: Home / Self Care | Attending: Ophthalmology

## 2018-09-03 DIAGNOSIS — Z79899 Other long term (current) drug therapy: Secondary | ICD-10-CM | POA: Insufficient documentation

## 2018-09-03 DIAGNOSIS — Z6836 Body mass index (BMI) 36.0-36.9, adult: Secondary | ICD-10-CM | POA: Diagnosis not present

## 2018-09-03 DIAGNOSIS — E669 Obesity, unspecified: Secondary | ICD-10-CM | POA: Diagnosis not present

## 2018-09-03 DIAGNOSIS — Z96659 Presence of unspecified artificial knee joint: Secondary | ICD-10-CM | POA: Insufficient documentation

## 2018-09-03 DIAGNOSIS — Z955 Presence of coronary angioplasty implant and graft: Secondary | ICD-10-CM | POA: Insufficient documentation

## 2018-09-03 DIAGNOSIS — I10 Essential (primary) hypertension: Secondary | ICD-10-CM | POA: Insufficient documentation

## 2018-09-03 DIAGNOSIS — G4730 Sleep apnea, unspecified: Secondary | ICD-10-CM | POA: Insufficient documentation

## 2018-09-03 DIAGNOSIS — Z87891 Personal history of nicotine dependence: Secondary | ICD-10-CM | POA: Insufficient documentation

## 2018-09-03 DIAGNOSIS — Z85828 Personal history of other malignant neoplasm of skin: Secondary | ICD-10-CM | POA: Diagnosis not present

## 2018-09-03 DIAGNOSIS — Z7982 Long term (current) use of aspirin: Secondary | ICD-10-CM | POA: Insufficient documentation

## 2018-09-03 DIAGNOSIS — F419 Anxiety disorder, unspecified: Secondary | ICD-10-CM | POA: Diagnosis not present

## 2018-09-03 DIAGNOSIS — H2512 Age-related nuclear cataract, left eye: Secondary | ICD-10-CM | POA: Diagnosis not present

## 2018-09-03 DIAGNOSIS — D649 Anemia, unspecified: Secondary | ICD-10-CM | POA: Insufficient documentation

## 2018-09-03 DIAGNOSIS — I2510 Atherosclerotic heart disease of native coronary artery without angina pectoris: Secondary | ICD-10-CM | POA: Insufficient documentation

## 2018-09-03 DIAGNOSIS — Z7984 Long term (current) use of oral hypoglycemic drugs: Secondary | ICD-10-CM | POA: Diagnosis not present

## 2018-09-03 HISTORY — DX: Anemia, unspecified: D64.9

## 2018-09-03 HISTORY — DX: Hypothyroidism, unspecified: E03.9

## 2018-09-03 HISTORY — DX: Atherosclerotic heart disease of native coronary artery without angina pectoris: I25.10

## 2018-09-03 HISTORY — PX: CATARACT EXTRACTION W/PHACO: SHX586

## 2018-09-03 SURGERY — PHACOEMULSIFICATION, CATARACT, WITH IOL INSERTION
Anesthesia: Monitor Anesthesia Care | Site: Eye | Laterality: Left

## 2018-09-03 MED ORDER — MIDAZOLAM HCL 2 MG/2ML IJ SOLN
INTRAMUSCULAR | Status: DC | PRN
  Administered 2018-09-03 (×2): 1 mg via INTRAVENOUS

## 2018-09-03 MED ORDER — MIDAZOLAM HCL 2 MG/2ML IJ SOLN
INTRAMUSCULAR | Status: AC
  Filled 2018-09-03: qty 2

## 2018-09-03 MED ORDER — ONDANSETRON HCL 4 MG/2ML IJ SOLN
INTRAMUSCULAR | Status: DC | PRN
  Administered 2018-09-03: 4 mg via INTRAVENOUS

## 2018-09-03 MED ORDER — NA CHONDROIT SULF-NA HYALURON 40-17 MG/ML IO SOLN
INTRAOCULAR | Status: AC
  Filled 2018-09-03: qty 1

## 2018-09-03 MED ORDER — TETRACAINE HCL 0.5 % OP SOLN
1.0000 [drp] | OPHTHALMIC | Status: AC | PRN
  Administered 2018-09-03 (×3): 1 [drp] via OPHTHALMIC

## 2018-09-03 MED ORDER — MOXIFLOXACIN HCL 0.5 % OP SOLN: 1.0000 [drp] | OPHTHALMIC | Status: DC | PRN

## 2018-09-03 MED ORDER — MOXIFLOXACIN HCL 0.5 % OP SOLN
OPHTHALMIC | Status: DC | PRN
  Administered 2018-09-03: 0.2 mL via OPHTHALMIC

## 2018-09-03 MED ORDER — POVIDONE-IODINE 5 % OP SOLN
OPHTHALMIC | Status: DC | PRN
  Administered 2018-09-03: 1 via OPHTHALMIC

## 2018-09-03 MED ORDER — POVIDONE-IODINE 5 % OP SOLN
OPHTHALMIC | Status: AC
  Filled 2018-09-03: qty 30

## 2018-09-03 MED ORDER — EPINEPHRINE PF 1 MG/ML IJ SOLN
INTRAOCULAR | Status: DC | PRN
  Administered 2018-09-03: 11:00:00 via OPHTHALMIC

## 2018-09-03 MED ORDER — SODIUM CHLORIDE 0.9 % IV SOLN
INTRAVENOUS | Status: DC
  Administered 2018-09-03 (×2): via INTRAVENOUS

## 2018-09-03 MED ORDER — TETRACAINE HCL 0.5 % OP SOLN
OPHTHALMIC | Status: AC
  Administered 2018-09-03: 1 [drp] via OPHTHALMIC
  Filled 2018-09-03: qty 4

## 2018-09-03 MED ORDER — ARMC OPHTHALMIC DILATING DROPS
OPHTHALMIC | Status: AC
  Administered 2018-09-03: 1 via OPHTHALMIC
  Filled 2018-09-03: qty 0.5

## 2018-09-03 MED ORDER — LIDOCAINE HCL (PF) 4 % IJ SOLN
INTRAOCULAR | Status: DC | PRN
  Administered 2018-09-03: 4 mL via OPHTHALMIC

## 2018-09-03 MED ORDER — MOXIFLOXACIN HCL 0.5 % OP SOLN
OPHTHALMIC | Status: AC
  Filled 2018-09-03: qty 3

## 2018-09-03 MED ORDER — EPINEPHRINE PF 1 MG/ML IJ SOLN
INTRAMUSCULAR | Status: AC
  Filled 2018-09-03: qty 2

## 2018-09-03 MED ORDER — ARMC OPHTHALMIC DILATING DROPS
1.0000 "application " | OPHTHALMIC | Status: AC
  Administered 2018-09-03 (×3): 1 via OPHTHALMIC

## 2018-09-03 MED ORDER — CARBACHOL 0.01 % IO SOLN
INTRAOCULAR | Status: DC | PRN
  Administered 2018-09-03: 0.5 mL via INTRAOCULAR

## 2018-09-03 MED ORDER — FENTANYL CITRATE (PF) 100 MCG/2ML IJ SOLN
INTRAMUSCULAR | Status: AC
  Filled 2018-09-03: qty 2

## 2018-09-03 MED ORDER — LIDOCAINE HCL (PF) 4 % IJ SOLN
INTRAMUSCULAR | Status: AC
  Filled 2018-09-03: qty 5

## 2018-09-03 MED ORDER — NA CHONDROIT SULF-NA HYALURON 40-17 MG/ML IO SOLN
INTRAOCULAR | Status: DC | PRN
  Administered 2018-09-03: 1 mL via INTRAOCULAR

## 2018-09-03 SURGICAL SUPPLY — 16 items
GLOVE BIO SURGEON STRL SZ8 (GLOVE) ×3 IMPLANT
GLOVE BIOGEL M 6.5 STRL (GLOVE) ×3 IMPLANT
GLOVE SURG LX 8.0 MICRO (GLOVE) ×2
GLOVE SURG LX STRL 8.0 MICRO (GLOVE) ×1 IMPLANT
GOWN STRL REUS W/ TWL LRG LVL3 (GOWN DISPOSABLE) ×2 IMPLANT
GOWN STRL REUS W/TWL LRG LVL3 (GOWN DISPOSABLE) ×4
LABEL CATARACT MEDS ST (LABEL) ×3 IMPLANT
LENS IOL TECNIS ITEC 21.0 (Intraocular Lens) ×3 IMPLANT
PACK CATARACT (MISCELLANEOUS) ×3 IMPLANT
PACK CATARACT BRASINGTON LX (MISCELLANEOUS) ×3 IMPLANT
PACK EYE AFTER SURG (MISCELLANEOUS) ×3 IMPLANT
SOL BSS BAG (MISCELLANEOUS) ×3
SOLUTION BSS BAG (MISCELLANEOUS) ×1 IMPLANT
SYR 5ML LL (SYRINGE) ×3 IMPLANT
WATER STERILE IRR 250ML POUR (IV SOLUTION) ×3 IMPLANT
WIPE NON LINTING 3.25X3.25 (MISCELLANEOUS) ×3 IMPLANT

## 2018-09-03 NOTE — Op Note (Signed)
PREOPERATIVE DIAGNOSIS:  Nuclear sclerotic cataract of the left eye.   POSTOPERATIVE DIAGNOSIS:  Nuclear sclerotic cataract of the left eye.   OPERATIVE PROCEDURE: Procedure(s): CATARACT EXTRACTION PHACO AND INTRAOCULAR LENS PLACEMENT (IOC)   SURGEON:  Birder Robson, MD.   ANESTHESIA:  Anesthesiologist: Gunnar Bulla, MD CRNA: Kelton Pillar, CRNA  1.      Managed anesthesia care. 2.     0.31ml of Shugarcaine was instilled following the paracentesis   COMPLICATIONS:  None.   TECHNIQUE:   Stop and chop   DESCRIPTION OF PROCEDURE:  The patient was examined and consented in the preoperative holding area where the aforementioned topical anesthesia was applied to the left eye and then brought back to the Operating Room where the left eye was prepped and draped in the usual sterile ophthalmic fashion and a lid speculum was placed. A paracentesis was created with the side port blade and the anterior chamber was filled with viscoelastic. A near clear corneal incision was performed with the steel keratome. A continuous curvilinear capsulorrhexis was performed with a cystotome followed by the capsulorrhexis forceps. Hydrodissection and hydrodelineation were carried out with BSS on a blunt cannula. The lens was removed in a stop and chop  technique and the remaining cortical material was removed with the irrigation-aspiration handpiece. The capsular bag was inflated with viscoelastic and the Technis [id number] lens was placed in the capsular bag without complication. The remaining viscoelastic was removed from the eye with the irrigation-aspiration handpiece. The wounds were hydrated. The anterior chamber was flushed with Miostat and the eye was inflated to physiologic pressure. 0.51ml Vigamox was placed in the anterior chamber. The wounds were found to be water tight. The eye was dressed with Vigamox. The patient was given protective glasses to wear throughout the day and a shield with which to  sleep tonight. The patient was also given drops with which to begin a drop regimen today and will follow-up with me in one day. Implant Name Type Inv. Item Serial No. Manufacturer Lot No. LRB No. Used  LENS IOL DIOP 21.0 - [id number] Intraocular Lens LENS IOL DIOP 21.0 [id number] AMO  Left 1    Procedure(s) with comments: CATARACT EXTRACTION PHACO AND INTRAOCULAR LENS PLACEMENT (IOC) (Left) - [place] 00:26.8 CDE 3.12 Fluid Pack Lot # [id number]  Electronically signed: Birder Robson 09/03/2018 11:40 AM

## 2018-09-03 NOTE — Transfer of Care (Signed)
Immediate Anesthesia Transfer of Care Note  Patient: Jackson NGHIEM Sr.  Procedure(s) Performed: CATARACT EXTRACTION PHACO AND INTRAOCULAR LENS PLACEMENT ([place]) (Left Eye)  Patient Location: PACU  Anesthesia Type:MAC  Level of Consciousness: awake, alert , oriented and patient cooperative  Airway & Oxygen Therapy: Patient Spontanous Breathing  Post-op Assessment: Report given to RN and Post -op Vital signs reviewed and stable  Post vital signs: Reviewed and stable  Last Vitals:  Vitals Value Taken Time  BP    Temp    Pulse    Resp    SpO2      Last Pain:  Vitals:   09/03/18 0906  TempSrc: Temporal  PainSc: 0-No pain         Complications: No apparent anesthesia complications

## 2018-09-03 NOTE — Anesthesia Postprocedure Evaluation (Signed)
Anesthesia Post Note  Patient: Jackson FERG Sr.  Procedure(s) Performed: CATARACT EXTRACTION PHACO AND INTRAOCULAR LENS PLACEMENT ([place]) (Left Eye)  Patient location during evaluation: PACU Anesthesia Type: MAC Level of consciousness: awake and alert Pain management: pain level controlled Vital Signs Assessment: post-procedure vital signs reviewed and stable Respiratory status: spontaneous breathing, nonlabored ventilation, respiratory function stable and patient connected to nasal cannula oxygen Cardiovascular status: stable and blood pressure returned to baseline Postop Assessment: no apparent nausea or vomiting Anesthetic complications: no     Last Vitals:  Vitals:   09/03/18 1141 09/03/18 1201  BP: 105/78 124/75  Pulse: (!) 59 60  Resp: 16 16  Temp: 36.5 C   SpO2: 99% 96%    Last Pain:  Vitals:   09/03/18 1201  TempSrc:   PainSc: 0-No pain                 Promiss Labarbera S

## 2018-09-03 NOTE — Anesthesia Preprocedure Evaluation (Signed)
Anesthesia Evaluation  Patient identified by MRN, date of birth, ID band Patient awake    Reviewed: Allergy & Precautions, NPO status , Patient's Chart, lab work & pertinent test results, reviewed documented beta blocker date and time   Airway Mallampati: III  TM Distance: >3 FB     Dental  (+) Chipped   Pulmonary sleep apnea , former smoker,           Cardiovascular hypertension, Pt. on medications and Pt. on home beta blockers + CAD       Neuro/Psych Anxiety    GI/Hepatic   Endo/Other  Hypothyroidism   Renal/GU      Musculoskeletal  (+) Arthritis ,   Abdominal   Peds  Hematology  (+) anemia ,   Anesthesia Other Findings Obese. Lbbb on EKG.  Reproductive/Obstetrics                             Anesthesia Physical Anesthesia Plan  ASA: III  Anesthesia Plan: General   Post-op Pain Management:    Induction: Intravenous  PONV Risk Score and Plan:   Airway Management Planned:   Additional Equipment:   Intra-op Plan:   Post-operative Plan:   Informed Consent: I have reviewed the patients History and Physical, chart, labs and discussed the procedure including the risks, benefits and alternatives for the proposed anesthesia with the patient or authorized representative who has indicated his/her understanding and acceptance.       Plan Discussed with: CRNA  Anesthesia Plan Comments:         Anesthesia Quick Evaluation

## 2018-09-03 NOTE — H&P (Signed)
All labs reviewed. Abnormal studies sent to patients PCP when indicated.  Previous H&P reviewed, patient examined, there are NO CHANGES.  Jackson Melendrez Porfilio2/18/202011:17 AM

## 2018-09-03 NOTE — Discharge Instructions (Signed)
Eye Surgery Discharge Instructions    Expect mild scratchy sensation or mild soreness. DO NOT RUB YOUR EYE!  The day of surgery:  Minimal physical activity, but bed rest is not required  No reading, computer work, or close hand work  No bending, lifting, or straining.  May watch TV  For 24 hours:  No driving, legal decisions, or alcoholic beverages  Safety precautions  Eat anything you prefer: It is better to start with liquids, then soup then solid foods.  _____ Eye patch should be worn until postoperative exam tomorrow.  ____ Solar shield eyeglasses should be worn for comfort in the sunlight/patch while sleeping  Resume all regular medications including aspirin or Coumadin if these were discontinued prior to surgery. You may shower, bathe, shave, or wash your hair. Tylenol may be taken for mild discomfort.  Call your doctor if you experience significant pain, nausea, or vomiting, fever > 101 or other signs of infection. [phone] or [phone] Specific instructions:  Follow-up Information    Birder Robson, MD Follow up on 09/04/2018.   Specialty:  Ophthalmology Why:  10:00 Contact information: [address] [place] [address] [phone]

## 2018-09-03 NOTE — Anesthesia Post-op Follow-up Note (Signed)
Anesthesia QCDR form completed.        

## 2018-09-30 ENCOUNTER — Encounter: Payer: Self-pay | Admitting: *Deleted

## 2018-10-01 ENCOUNTER — Ambulatory Visit: Admission: RE | Admit: 2018-10-01 | Payer: Medicare HMO | Source: Ambulatory Visit | Admitting: Ophthalmology

## 2018-10-01 ENCOUNTER — Encounter: Admission: RE | Payer: Self-pay | Source: Ambulatory Visit

## 2018-10-01 SURGERY — PHACOEMULSIFICATION, CATARACT, WITH IOL INSERTION
Anesthesia: Choice | Laterality: Right

## 2018-10-15 ENCOUNTER — Telehealth: Payer: Self-pay | Admitting: Urology

## 2018-10-15 ENCOUNTER — Ambulatory Visit
Admission: RE | Admit: 2018-10-15 | Discharge: 2018-10-15 | Disposition: A | Discharge status: Home / Self Care | Payer: Medicare HMO | Source: Ambulatory Visit | Attending: Internal Medicine | Admitting: Internal Medicine

## 2018-10-15 ENCOUNTER — Other Ambulatory Visit: Payer: Self-pay

## 2018-10-15 ENCOUNTER — Other Ambulatory Visit: Payer: Self-pay | Admitting: Internal Medicine

## 2018-10-15 DIAGNOSIS — N5089 Other specified disorders of the male genital organs: Secondary | ICD-10-CM

## 2018-10-15 DIAGNOSIS — S3994XA Unspecified injury of external genitals, initial encounter: Secondary | ICD-10-CM | POA: Diagnosis present

## 2018-10-15 MED ORDER — SULFAMETHOXAZOLE-TRIMETHOPRIM 800-160 MG PO TABS: 1.0000 | ORAL_TABLET | Freq: Two times a day (BID) | ORAL | 0 refills | Status: AC

## 2018-10-15 NOTE — Telephone Encounter (Signed)
Dr. Doy Hutching contacted me this afternoon regarding this patient who got his scrotum trapped between the toilet seat and bowl 3 to 4 days ago and presented complaining of scrotal pain and swelling.  I had recommended a stat ultrasound and was going to see him after the ultrasound however he could only have the same day ultrasound done [place] and I was never contacted with report by radiology.  I reviewed the ultrasound.  There is hypervascularity of the left testis and epididymis and a small, complex reactive hydrocele.  There is no evidence of abscess or testicular rupture.  Review of his chart remarkable for an E. coli UTI back on 3/17 and he completed antibiotics.  He states this is been his second UTI.  Although this may be inflammatory with a recent UTI I recommended starting antibiotic therapy.  An Rx Septra DS was sent to his pharmacy. Recommended scrotal support and stay supine as much as possible over the next 3 to 4 days.  We will see him in the office in approximately 3 to 4 weeks however he was instructed to call should his pain and swelling worsen.

## 2018-11-05 ENCOUNTER — Ambulatory Visit: Payer: Medicare HMO | Admitting: Urology

## 2018-11-06 ENCOUNTER — Encounter: Payer: Self-pay | Admitting: Urology

## 2018-11-26 ENCOUNTER — Ambulatory Visit: Payer: Medicare HMO | Admitting: Urology

## 2018-11-26 ENCOUNTER — Encounter: Payer: Self-pay | Admitting: Urology

## 2018-11-26 ENCOUNTER — Other Ambulatory Visit: Payer: Self-pay

## 2018-11-26 VITALS — BP 155/94 | HR 81 | Ht 71.0 in | Wt 270.0 lb

## 2018-11-26 DIAGNOSIS — N401 Enlarged prostate with lower urinary tract symptoms: Secondary | ICD-10-CM

## 2018-11-26 DIAGNOSIS — R3912 Poor urinary stream: Secondary | ICD-10-CM

## 2018-11-26 DIAGNOSIS — N453 Epididymo-orchitis: Secondary | ICD-10-CM

## 2018-11-26 NOTE — Progress Notes (Signed)
11/26/2018 9:45 PM   Jackson Salm Sr. 09/05/1950 [id number]  Referring provider: Idelle Crouch, MD [facility] [place], [place] [address]  Chief Complaint  Patient presents with  . Testicle Pain    HPI: Dr. Doy Hutching saw this patient on 10/15/2018 with a a 4-day history of left scrotal pain and swelling which occurred after accidentally catching his scrotum between the toilet lid and rim.  I had recommended a scrotal ultrasound and was going to see the patient however he had to travel to [facility] for his ultrasound and then went home.  His ultrasound showed a complex left hydrocele and a hypervascular left epididymis felt consistent with epididymitis.  He had been seen 2 weeks earlier on 3/17 complain of urinary frequency, urgency and dysuria and was noted to have pyuria with a urine culture positive for E. coli.  He was treated with Keflex.  He was treated with a seven-day course of Bactrim for his scrotal swelling.  A urinalysis was not performed at that visit.  He states the swelling has improved by 50%.  His dysuria has resolved.  He has mild tenderness present.  He complains of recurrent UTIs although on 2 occasions he had bacteriuria without symptoms.  He does have lower urinary tract symptoms primarily decreased force and caliber of his urinary stream.  He had been on tamsulosin in the past but did not feel this significantly helped his symptoms.  He also has baseline urinary frequency and urgency.  He does take saw palmetto.  Denies previous urologic evaluation.   PMH: Past Medical History:  Diagnosis Date  . Anemia   . Anxiety   . Arthritis   . Cancer ([place])    skin  . Coronary artery disease   . Hypertension   . Hypothyroidism   . Lower back pain   . Pre-diabetes   . Prostate enlargement   . Sleep apnea    CPAP    Surgical History: Past Surgical History:  Procedure Laterality Date  . broken wrist Right   . CARDIAC  CATHETERIZATION     x 2  . CATARACT EXTRACTION W/PHACO Left 09/03/2018   Procedure: CATARACT EXTRACTION PHACO AND INTRAOCULAR LENS PLACEMENT (IOC);  Surgeon: Birder Robson, MD;  Location: [facility] ORS;  Service: Ophthalmology;  Laterality: Left;  [place] 00:26.8 CDE 3.12 Fluid Pack Lot # [id number]  . CORONARY ANGIOPLASTY     STENT X 2  . HERNIA REPAIR    . JOINT REPLACEMENT  05/2018   TKR  . KNEE ARTHROPLASTY Left 06/03/2018   Procedure: COMPUTER ASSISTED TOTAL KNEE ARTHROPLASTY;  Surgeon: Dereck Leep, MD;  Location: [facility] ORS;  Service: Orthopedics;  Laterality: Left;  . SEPTOPLASTY    . SKIN CANCER EXCISION    . THYROID SURGERY      Home Medications:  Allergies as of 11/26/2018      Reactions   Metoprolol Shortness Of Breath   Hyperactivity    Meperidine Other (See Comments)   "I felt like I was leaving this world." Confusion   Doxycycline Other (See Comments)   Difficulty sleeping, felt like his heart was beating in his stomach   Penicillins Itching, Rash, Other (See Comments)   Has patient had a PCN reaction causing immediate rash, facial/tongue/throat swelling, SOB or lightheadedness with hypotension: Unknown Has patient had a PCN reaction causing severe rash involving mucus membranes or skin necrosis: No Has patient had a PCN reaction that required hospitalization: Yes Has patient had  a PCN reaction occurring within the last 10 years: No If all of the above answers are "NO", then may proceed with Cephalosporin use.      Medication List       Accurate as of Nov 26, 2018  9:45 PM. If you have any questions, ask your nurse or doctor.        acetaminophen 500 MG tablet Commonly known as:  TYLENOL Take 500-1,000 mg by mouth every 6 (six) hours as needed (for pain.).   ALPRAZolam 0.5 MG tablet Commonly known as:  XANAX Take 0.5 mg by mouth at bedtime as needed for anxiety or sleep.   aspirin EC 81 MG tablet Take 81 mg by mouth daily.   carvedilol 6.25 MG tablet  Commonly known as:  COREG Take 6.25 mg by mouth 2 (two) times daily.   cloNIDine 0.1 MG tablet Commonly known as:  CATAPRES Take 0.1 mg by mouth 2 (two) times daily.   diltiazem 240 MG 24 hr capsule Commonly known as:  CARDIZEM CD Take 240 mg by mouth daily.   enoxaparin 40 MG/0.4ML injection Commonly known as:  LOVENOX Inject 0.4 mLs (40 mg total) into the skin daily for 14 days.   fluticasone 50 MCG/ACT nasal spray Commonly known as:  FLONASE Place 2 sprays into both nostrils at bedtime as needed for allergies or rhinitis.   gabapentin 100 MG capsule Commonly known as:  NEURONTIN Take 100-300 mg by mouth See admin instructions. Take 1 capsule (100 mg) in the morning & take 3 capsules (300 mg) by mouth at night.   loratadine 10 MG tablet Commonly known as:  CLARITIN Take 10 mg by mouth daily as needed (nasal allergies.).   Lubricant Eye Drops 0.4-0.3 % Soln Generic drug:  Polyethyl Glycol-Propyl Glycol Place 1 drop into both eyes 4 (four) times daily as needed (dry/irritated eyes.).   metoprolol tartrate 25 MG tablet Commonly known as:  LOPRESSOR Take 1 tablet (25 mg total) by mouth 2 (two) times daily for 20 days.   multivitamin with minerals tablet Take by mouth.   multivitamin with minerals Tabs tablet Take 1 tablet by mouth daily.   neomycin-polymyxin-dexamethasone 0.1 % ophthalmic suspension Commonly known as:  MAXITROL Place 1 drop into the right eye 3 (three) times daily.   NON FORMULARY Place 1 drop into the left eye 2 (two) times daily. PREDNISOLONE-GATIFLOX-BROMFENAC   oxyCODONE 5 MG immediate release tablet Commonly known as:  Oxy IR/ROXICODONE Take 1 tablet (5 mg total) by mouth every 4 (four) hours as needed for moderate pain (pain score 4-6).   psyllium 0.52 g capsule Commonly known as:  REGULOID Take 0.52 g by mouth daily. Metamucil capsule   saw palmetto 500 MG capsule Take 500 mg by mouth daily.   tobramycin-dexamethasone ophthalmic  solution Commonly known as:  TOBRADEX   traMADol 50 MG tablet Commonly known as:  ULTRAM Take 1-2 tablets (50-100 mg total) by mouth every 4 (four) hours as needed for moderate pain.   triamcinolone cream 0.1 % Commonly known as:  KENALOG Apply 1 application topically daily as needed (for itching).       Allergies:  Allergies  Allergen Reactions  . Metoprolol Shortness Of Breath    Hyperactivity   . Meperidine Other (See Comments)    "I felt like I was leaving this world." Confusion  . Doxycycline Other (See Comments)    Difficulty sleeping, felt like his heart was beating in his stomach  . Penicillins Itching, Rash and Other (  See Comments)    Has patient had a PCN reaction causing immediate rash, facial/tongue/throat swelling, SOB or lightheadedness with hypotension: Unknown Has patient had a PCN reaction causing severe rash involving mucus membranes or skin necrosis: No Has patient had a PCN reaction that required hospitalization: Yes Has patient had a PCN reaction occurring within the last 10 years: No If all of the above answers are "NO", then may proceed with Cephalosporin use.     Family History: No family history on file.  Social History:  reports that he quit smoking about 39 years ago. He has never used smokeless tobacco. He reports that he does not drink alcohol or use drugs.  ROS: UROLOGY Frequent Urination?: Yes Hard to postpone urination?: No Burning/pain with urination?: No Get up at night to urinate?: No Leakage of urine?: No Urine stream starts and stops?: Yes Trouble starting stream?: No Do you have to strain to urinate?: No Blood in urine?: No Urinary tract infection?: Yes Sexually transmitted disease?: No Injury to kidneys or bladder?: No Painful intercourse?: No Weak stream?: Yes Erection problems?: No Penile pain?: No  Gastrointestinal Nausea?: No Vomiting?: No Indigestion/heartburn?: No Diarrhea?: No Constipation?: Yes   Constitutional Fever: No Night sweats?: No Weight loss?: No Fatigue?: Yes  Skin Skin rash/lesions?: No Itching?: No  Eyes Blurred vision?: No Double vision?: No  Ears/Nose/Throat Sore throat?: No Sinus problems?: Yes  Hematologic/Lymphatic Swollen glands?: No Easy bruising?: Yes  Cardiovascular Leg swelling?: No Chest pain?: No  Respiratory Cough?: No Shortness of breath?: No  Endocrine Excessive thirst?: No  Musculoskeletal Back pain?: Yes Joint pain?: Yes  Neurological Headaches?: No Dizziness?: No  Psychologic Depression?: No Anxiety?: No  Physical Exam: BP (!) 155/94 (BP Location: Left Arm, Patient Position: Sitting, Cuff Size: Large)   Pulse 81   Ht 5\' 11"  (1.803 m)   Wt 270 lb (122.5 kg)   BMI 37.66 kg/m   Constitutional:  Alert and oriented, No acute distress. HEENT: [place] AT, moist mucus membranes.  Trachea midline, no masses. Cardiovascular: No clubbing, cyanosis, or edema. Respiratory: Normal respiratory effort, no increased work of breathing. GI: Abdomen is soft, nontender, nondistended, no abdominal masses GU: No CVA tenderness.  Phallus is without lesions.  Scrotum is without significant swelling, erythema or edema.  Right testis and epididymis palpably normal.  There is enlargement and induration with mild tenderness of the left epididymis.  No significant hydrocele present. Lymph: No cervical or inguinal lymphadenopathy. Skin: No rashes, bruises or suspicious lesions. Neurologic: Grossly intact, no focal deficits, moving all 4 extremities. Psychiatric: Normal mood and affect.   Pertinent Imaging: Scrotal ultrasound was personally reviewed.  Assessment & Plan:   Left epididymitis-improving.  He did not have a urinalysis on 3/31 and there is no way to tell if this is an infectious epididymitis or an inflammatory epididymitis secondary to trauma.  I did recommend a 3-week course of antibiotics.  I had sent in a 21-day course of Septra DS  and Dr. Doy Hutching had sent in a 7-day course.  He took a 7-day course and will pick up this additional prescription.  Schedule renal ultrasound and follow-up in approximately 1 month for IPSS, urinalysis and ultrasound review.   Abbie Sons, [place] [address], [place], [place] [address] [phone]

## 2018-11-28 ENCOUNTER — Encounter: Payer: Self-pay | Admitting: *Deleted

## 2018-11-28 ENCOUNTER — Other Ambulatory Visit: Payer: Self-pay

## 2018-11-29 ENCOUNTER — Other Ambulatory Visit
Admission: RE | Admit: 2018-11-29 | Discharge: 2018-11-29 | Disposition: A | Discharge status: Home / Self Care | Payer: Medicare HMO | Source: Ambulatory Visit | Attending: Ophthalmology | Admitting: Ophthalmology

## 2018-11-29 DIAGNOSIS — Z1159 Encounter for screening for other viral diseases: Secondary | ICD-10-CM | POA: Insufficient documentation

## 2018-11-29 NOTE — Discharge Instructions (Signed)

## 2018-12-01 LAB — NOVEL CORONAVIRUS, NAA (HOSP ORDER, SEND-OUT TO REF LAB; TAT 18-24 HRS): SARS-CoV-2, NAA: NOT DETECTED

## 2018-12-02 NOTE — Anesthesia Preprocedure Evaluation (Addendum)
Anesthesia Evaluation  Patient identified by MRN, date of birth, ID band Patient awake    Reviewed: Allergy & Precautions, NPO status , Patient's Chart, lab work & pertinent test results  History of Anesthesia Complications Negative for: history of anesthetic complications  Airway Mallampati: IV   Neck ROM: Full    Dental no notable dental hx.    Pulmonary sleep apnea and Continuous Positive Airway Pressure Ventilation , former smoker (quit 1980),    Pulmonary exam normal breath sounds clear to auscultation       Cardiovascular hypertension, + CAD  Normal cardiovascular exam+ dysrhythmias (LBBB)  Rhythm:Regular Rate:Normal  Dilated cardiomyopathy   Neuro/Psych PSYCHIATRIC DISORDERS Anxiety negative neurological ROS     GI/Hepatic negative GI ROS,   Endo/Other  Hypothyroidism Obesity   Renal/GU negative Renal ROS     Musculoskeletal   Abdominal   Peds  Hematology  (+) Blood dyscrasia, anemia , Melanoma    Anesthesia Other Findings   Reproductive/Obstetrics                            Anesthesia Physical Anesthesia Plan  ASA: III  Anesthesia Plan: MAC   Post-op Pain Management:    Induction: Intravenous  PONV Risk Score and Plan: 1 and TIVA and Midazolam  Airway Management Planned: Natural Airway  Additional Equipment:   Intra-op Plan:   Post-operative Plan:   Informed Consent: I have reviewed the patients History and Physical, chart, labs and discussed the procedure including the risks, benefits and alternatives for the proposed anesthesia with the patient or authorized representative who has indicated his/her understanding and acceptance.       Plan Discussed with: CRNA  Anesthesia Plan Comments:        Anesthesia Quick Evaluation

## 2018-12-03 ENCOUNTER — Other Ambulatory Visit: Payer: Self-pay

## 2018-12-03 ENCOUNTER — Encounter
Admission: RE | Disposition: A | Discharge status: Home / Self Care | Payer: Self-pay | Source: Home / Self Care | Attending: Ophthalmology

## 2018-12-03 ENCOUNTER — Ambulatory Visit: Payer: Medicare HMO | Admitting: Anesthesiology

## 2018-12-03 ENCOUNTER — Ambulatory Visit
Admission: RE | Admit: 2018-12-03 | Discharge: 2018-12-03 | Disposition: A | Discharge status: Home / Self Care | Payer: Medicare HMO | Attending: Ophthalmology | Admitting: Ophthalmology

## 2018-12-03 DIAGNOSIS — I2510 Atherosclerotic heart disease of native coronary artery without angina pectoris: Secondary | ICD-10-CM | POA: Insufficient documentation

## 2018-12-03 DIAGNOSIS — Z87891 Personal history of nicotine dependence: Secondary | ICD-10-CM | POA: Insufficient documentation

## 2018-12-03 DIAGNOSIS — Z85820 Personal history of malignant melanoma of skin: Secondary | ICD-10-CM | POA: Insufficient documentation

## 2018-12-03 DIAGNOSIS — H2511 Age-related nuclear cataract, right eye: Secondary | ICD-10-CM | POA: Insufficient documentation

## 2018-12-03 DIAGNOSIS — F419 Anxiety disorder, unspecified: Secondary | ICD-10-CM | POA: Diagnosis not present

## 2018-12-03 DIAGNOSIS — G4730 Sleep apnea, unspecified: Secondary | ICD-10-CM | POA: Insufficient documentation

## 2018-12-03 DIAGNOSIS — Z6838 Body mass index (BMI) 38.0-38.9, adult: Secondary | ICD-10-CM | POA: Insufficient documentation

## 2018-12-03 DIAGNOSIS — I10 Essential (primary) hypertension: Secondary | ICD-10-CM | POA: Insufficient documentation

## 2018-12-03 DIAGNOSIS — E669 Obesity, unspecified: Secondary | ICD-10-CM | POA: Diagnosis not present

## 2018-12-03 DIAGNOSIS — Z955 Presence of coronary angioplasty implant and graft: Secondary | ICD-10-CM | POA: Insufficient documentation

## 2018-12-03 DIAGNOSIS — H9190 Unspecified hearing loss, unspecified ear: Secondary | ICD-10-CM | POA: Insufficient documentation

## 2018-12-03 DIAGNOSIS — I420 Dilated cardiomyopathy: Secondary | ICD-10-CM | POA: Insufficient documentation

## 2018-12-03 DIAGNOSIS — Z7982 Long term (current) use of aspirin: Secondary | ICD-10-CM | POA: Diagnosis not present

## 2018-12-03 DIAGNOSIS — Z96659 Presence of unspecified artificial knee joint: Secondary | ICD-10-CM | POA: Insufficient documentation

## 2018-12-03 DIAGNOSIS — Z79899 Other long term (current) drug therapy: Secondary | ICD-10-CM | POA: Insufficient documentation

## 2018-12-03 HISTORY — PX: CATARACT EXTRACTION W/PHACO: SHX586

## 2018-12-03 SURGERY — PHACOEMULSIFICATION, CATARACT, WITH IOL INSERTION
Anesthesia: Monitor Anesthesia Care | Site: Eye | Laterality: Right

## 2018-12-03 MED ORDER — LIDOCAINE HCL (PF) 2 % IJ SOLN
INTRAOCULAR | Status: DC | PRN
  Administered 2018-12-03: 1 mL

## 2018-12-03 MED ORDER — MIDAZOLAM HCL 2 MG/2ML IJ SOLN
INTRAMUSCULAR | Status: AC
  Filled 2018-12-03: qty 2

## 2018-12-03 MED ORDER — PROPOFOL 10 MG/ML IV BOLUS
INTRAVENOUS | Status: AC
  Filled 2018-12-03: qty 20

## 2018-12-03 MED ORDER — ARMC OPHTHALMIC DILATING DROPS
1.0000 "application " | OPHTHALMIC | Status: DC | PRN
  Administered 2018-12-03 (×3): 1 via OPHTHALMIC

## 2018-12-03 MED ORDER — FENTANYL CITRATE (PF) 100 MCG/2ML IJ SOLN
INTRAMUSCULAR | Status: DC | PRN
  Administered 2018-12-03: 50 ug via INTRAVENOUS

## 2018-12-03 MED ORDER — MIDAZOLAM HCL 2 MG/2ML IJ SOLN
INTRAMUSCULAR | Status: DC | PRN
  Administered 2018-12-03: 2 mg via INTRAVENOUS

## 2018-12-03 MED ORDER — LACTATED RINGERS IV SOLN: INTRAVENOUS | Status: DC

## 2018-12-03 MED ORDER — SODIUM CHLORIDE 0.9 % IV SOLN: INTRAVENOUS | Status: DC

## 2018-12-03 MED ORDER — MOXIFLOXACIN HCL 0.5 % OP SOLN
OPHTHALMIC | Status: DC | PRN
  Administered 2018-12-03: 0.2 mL via OPHTHALMIC

## 2018-12-03 MED ORDER — TETRACAINE HCL 0.5 % OP SOLN
1.0000 [drp] | OPHTHALMIC | Status: DC | PRN
  Administered 2018-12-03 (×3): 1 [drp] via OPHTHALMIC

## 2018-12-03 MED ORDER — PROPOFOL 500 MG/50ML IV EMUL
INTRAVENOUS | Status: AC
  Filled 2018-12-03: qty 50

## 2018-12-03 MED ORDER — NA CHONDROIT SULF-NA HYALURON 40-17 MG/ML IO SOLN
INTRAOCULAR | Status: DC | PRN
  Administered 2018-12-03: 1 mL via INTRAOCULAR

## 2018-12-03 MED ORDER — FENTANYL CITRATE (PF) 100 MCG/2ML IJ SOLN
INTRAMUSCULAR | Status: AC
  Filled 2018-12-03: qty 2

## 2018-12-03 MED ORDER — EPINEPHRINE PF 1 MG/ML IJ SOLN
INTRAOCULAR | Status: DC | PRN
  Administered 2018-12-03: 64 mL via OPHTHALMIC

## 2018-12-03 SURGICAL SUPPLY — 19 items
CANNULA ANT/CHMB 27G (MISCELLANEOUS) ×1 IMPLANT
CANNULA ANT/CHMB 27GA (MISCELLANEOUS) ×3 IMPLANT
GLOVE SURG LX 8.0 MICRO (GLOVE) ×2
GLOVE SURG LX STRL 8.0 MICRO (GLOVE) ×1 IMPLANT
GLOVE SURG TRIUMPH 8.0 PF LTX (GLOVE) ×3 IMPLANT
GOWN STRL REUS W/ TWL LRG LVL3 (GOWN DISPOSABLE) ×2 IMPLANT
GOWN STRL REUS W/TWL LRG LVL3 (GOWN DISPOSABLE) ×4
LENS IOL TECNIS ITEC 21.0 (Intraocular Lens) ×2 IMPLANT
MARKER SKIN DUAL TIP RULER LAB (MISCELLANEOUS) ×3 IMPLANT
NDL FILTER BLUNT 18X1 1/2 (NEEDLE) ×1 IMPLANT
NDL RETROBULBAR .5 NSTRL (NEEDLE) ×3 IMPLANT
NEEDLE FILTER BLUNT 18X 1/2SAF (NEEDLE) ×2
NEEDLE FILTER BLUNT 18X1 1/2 (NEEDLE) ×1 IMPLANT
PACK CATARACT BRASINGTON (MISCELLANEOUS) ×2 IMPLANT
PACK EYE AFTER SURG (MISCELLANEOUS) ×3 IMPLANT
PACK OPTHALMIC (MISCELLANEOUS) ×3 IMPLANT
SYR 3ML LL SCALE MARK (SYRINGE) ×3 IMPLANT
SYR TB 1ML LUER SLIP (SYRINGE) ×3 IMPLANT
WIPE NON LINTING 3.25X3.25 (MISCELLANEOUS) ×3 IMPLANT

## 2018-12-03 NOTE — Anesthesia Procedure Notes (Signed)
Procedure Name: MAC Date/Time: 12/03/2018 9:32 AM Performed by: Cameron Ali, CRNA Pre-anesthesia Checklist: Patient identified, Emergency Drugs available, Suction available, Timeout performed and Patient being monitored Patient Re-evaluated:Patient Re-evaluated prior to induction Oxygen Delivery Method: Nasal cannula Placement Confirmation: positive ETCO2

## 2018-12-03 NOTE — Transfer of Care (Signed)
Immediate Anesthesia Transfer of Care Note  Patient: Jackson MCCLUNE Sr.  Procedure(s) Performed: CATARACT EXTRACTION PHACO AND INTRAOCULAR LENS PLACEMENT ([place])  RIGHT (Right Eye)  Patient Location: PACU  Anesthesia Type: MAC  Level of Consciousness: awake, alert  and patient cooperative  Airway and Oxygen Therapy: Patient Spontanous Breathing and Patient connected to supplemental oxygen  Post-op Assessment: Post-op Vital signs reviewed, Patient's Cardiovascular Status Stable, Respiratory Function Stable, Patent Airway and No signs of Nausea or vomiting  Post-op Vital Signs: Reviewed and stable  Complications: No apparent anesthesia complications

## 2018-12-03 NOTE — Anesthesia Postprocedure Evaluation (Signed)
Anesthesia Post Note  Patient: Jackson BEEDLE Sr.  Procedure(s) Performed: CATARACT EXTRACTION PHACO AND INTRAOCULAR LENS PLACEMENT ([place])  RIGHT (Right Eye)  Patient location during evaluation: PACU Anesthesia Type: MAC Level of consciousness: awake and alert, oriented and patient cooperative Pain management: pain level controlled Vital Signs Assessment: post-procedure vital signs reviewed and stable Respiratory status: spontaneous breathing, nonlabored ventilation and respiratory function stable Cardiovascular status: blood pressure returned to baseline and stable Postop Assessment: adequate PO intake Anesthetic complications: no    Darrin Nipper

## 2018-12-03 NOTE — H&P (Signed)
All labs reviewed. Abnormal studies sent to patients PCP when indicated.  Previous H&P reviewed, patient examined, there are NO CHANGES.  Jackson Goldwasser Porfilio5/19/20209:12 AM

## 2018-12-03 NOTE — Op Note (Signed)
PREOPERATIVE DIAGNOSIS:  Nuclear sclerotic cataract of the right eye.   POSTOPERATIVE DIAGNOSIS:  H25.011  CATARACT   OPERATIVE PROCEDURE: Procedure(s): CATARACT EXTRACTION PHACO AND INTRAOCULAR LENS PLACEMENT ([facility])  RIGHT   SURGEON:  Birder Robson, MD.   ANESTHESIA:  Anesthesiologist: Darrin Nipper, MD CRNA: Cameron Ali, CRNA  1.      Managed anesthesia care. 2.      0.28ml of Shugarcaine was instilled in the eye following the paracentesis.   COMPLICATIONS:  None.   TECHNIQUE:   Stop and chop   DESCRIPTION OF PROCEDURE:  The patient was examined and consented in the preoperative holding area where the aforementioned topical anesthesia was applied to the right eye and then brought back to the Operating Room where the right eye was prepped and draped in the usual sterile ophthalmic fashion and a lid speculum was placed. A paracentesis was created with the side port blade and the anterior chamber was filled with viscoelastic. A near clear corneal incision was performed with the steel keratome. A continuous curvilinear capsulorrhexis was performed with a cystotome followed by the capsulorrhexis forceps. Hydrodissection and hydrodelineation were carried out with BSS on a blunt cannula. The lens was removed in a stop and chop  technique and the remaining cortical material was removed with the irrigation-aspiration handpiece. The capsular bag was inflated with viscoelastic and the Technis [id number]  lens was placed in the capsular bag without complication. The remaining viscoelastic was removed from the eye with the irrigation-aspiration handpiece. The wounds were hydrated. The anterior chamber was flushed with Miostat and the eye was inflated to physiologic pressure. 0.12ml of Vigamox was placed in the anterior chamber. The wounds were found to be water tight. The eye was dressed with Vigamox. The patient was given protective glasses to wear throughout the day and a shield with which to sleep  tonight. The patient was also given drops with which to begin a drop regimen today and will follow-up with me in one day. Implant Name Type Inv. Item Serial No. Manufacturer Lot No. LRB No. Used  LENS IOL DIOP 21.0 - [id number] Intraocular Lens LENS IOL DIOP 21.0 [id number]  Right 1   Procedure(s): CATARACT EXTRACTION PHACO AND INTRAOCULAR LENS PLACEMENT ([facility])  RIGHT (Right)  Electronically signed: Birder Robson 12/03/2018 9:53 AM

## 2018-12-04 ENCOUNTER — Encounter: Payer: Self-pay | Admitting: Ophthalmology

## 2018-12-11 ENCOUNTER — Telehealth: Payer: Self-pay | Admitting: Urology

## 2018-12-11 DIAGNOSIS — N401 Enlarged prostate with lower urinary tract symptoms: Secondary | ICD-10-CM

## 2018-12-11 DIAGNOSIS — R3912 Poor urinary stream: Secondary | ICD-10-CM

## 2018-12-11 NOTE — Telephone Encounter (Signed)
Pt wanted to let Dr Bernardo Heater know that he had lab work done at [facility] (Dr Doy Hutching) and he states that his kidney function is slightly worse.

## 2018-12-12 NOTE — Addendum Note (Signed)
Addended by: Verlene Mayer A on: 12/12/2018 03:39 PM   Modules accepted: Orders

## 2018-12-12 NOTE — Telephone Encounter (Signed)
Notified patient-verbalized understanding. Order for Renal Ultrasound placed. Due to Covid patient is aware the delay in getting this scheduled. Will be notified once scheduled.

## 2018-12-12 NOTE — Telephone Encounter (Signed)
Although slightly increased his kidney function is still within normal range

## 2019-01-06 ENCOUNTER — Other Ambulatory Visit: Payer: Self-pay

## 2019-01-06 ENCOUNTER — Ambulatory Visit
Admission: RE | Admit: 2019-01-06 | Discharge: 2019-01-06 | Disposition: A | Discharge status: Home / Self Care | Payer: Medicare HMO | Source: Ambulatory Visit | Attending: Urology | Admitting: Urology

## 2019-01-06 DIAGNOSIS — R3912 Poor urinary stream: Secondary | ICD-10-CM | POA: Insufficient documentation

## 2019-01-06 DIAGNOSIS — I10 Essential (primary) hypertension: Secondary | ICD-10-CM | POA: Insufficient documentation

## 2019-01-06 DIAGNOSIS — Z79899 Other long term (current) drug therapy: Secondary | ICD-10-CM | POA: Diagnosis not present

## 2019-01-06 DIAGNOSIS — N401 Enlarged prostate with lower urinary tract symptoms: Secondary | ICD-10-CM | POA: Diagnosis not present

## 2019-01-06 DIAGNOSIS — G4730 Sleep apnea, unspecified: Secondary | ICD-10-CM | POA: Insufficient documentation

## 2019-01-06 DIAGNOSIS — E039 Hypothyroidism, unspecified: Secondary | ICD-10-CM | POA: Insufficient documentation

## 2019-01-06 DIAGNOSIS — Z7982 Long term (current) use of aspirin: Secondary | ICD-10-CM | POA: Insufficient documentation

## 2019-01-06 DIAGNOSIS — I2510 Atherosclerotic heart disease of native coronary artery without angina pectoris: Secondary | ICD-10-CM | POA: Diagnosis not present

## 2019-01-09 ENCOUNTER — Ambulatory Visit: Payer: Medicare HMO | Admitting: Urology

## 2019-01-09 ENCOUNTER — Other Ambulatory Visit: Payer: Self-pay

## 2019-01-09 ENCOUNTER — Encounter: Payer: Self-pay | Admitting: Urology

## 2019-01-09 VITALS — BP 170/103 | HR 99 | Ht 71.0 in | Wt 268.0 lb

## 2019-01-09 DIAGNOSIS — R3912 Poor urinary stream: Secondary | ICD-10-CM | POA: Diagnosis not present

## 2019-01-09 DIAGNOSIS — N401 Enlarged prostate with lower urinary tract symptoms: Secondary | ICD-10-CM

## 2019-01-09 LAB — MICROSCOPIC EXAMINATION
Bacteria, UA: NONE SEEN
RBC, Urine: NONE SEEN /hpf (ref 0–2)
WBC, UA: NONE SEEN /hpf (ref 0–5)

## 2019-01-09 LAB — URINALYSIS, COMPLETE
Bilirubin, UA: NEGATIVE
Ketones, UA: NEGATIVE
Leukocytes,UA: NEGATIVE
Nitrite, UA: NEGATIVE
Protein,UA: NEGATIVE
Specific Gravity, UA: 1.01 (ref 1.005–1.030)
Urobilinogen, Ur: 0.2 mg/dL (ref 0.2–1.0)
pH, UA: 5 (ref 5.0–7.5)

## 2019-01-09 NOTE — Progress Notes (Signed)
01/09/2019 3:35 PM   Jackson Salm Sr. 1950/08/10 [id number]  Referring provider: Idelle Crouch, MD [facility] [place],  [place] [address]  Chief Complaint  Patient presents with  . Benign Prostatic Hypertrophy    HPI: 68 year old male presents for follow-up of BPH and recurrent UTIs.  Refer to my previous note of 11/26/2018.  His scrotal pain has resolved.  He feels his voiding symptoms have improved and has intermittency approximately 50% of the time otherwise he has mild lower urinary tract symptoms.  IPSS completed today was 13/2.  Denies dysuria, gross hematuria or flank/abdominal/pelvic/scrotal pain.  Renal ultrasound performed 01/06/2019 showed no upper tract abnormalities.   PMH: Past Medical History:  Diagnosis Date  . Anemia   . Anxiety   . Arthritis   . Cancer ([place])    skin  . Coronary artery disease   . Hypertension   . Hypothyroidism   . Lower back pain   . Pre-diabetes   . Prostate enlargement   . Sleep apnea    CPAP    Surgical History: Past Surgical History:  Procedure Laterality Date  . broken wrist Right   . CARDIAC CATHETERIZATION     x 2  . CATARACT EXTRACTION W/PHACO Left 09/03/2018   Procedure: CATARACT EXTRACTION PHACO AND INTRAOCULAR LENS PLACEMENT ([facility]);  Surgeon: Birder Robson, MD;  Location: [facility] ORS;  Service: Ophthalmology;  Laterality: Left;  [place] 00:26.8 CDE 3.12 Fluid Pack Lot # [id number]  . CATARACT EXTRACTION W/PHACO Right 12/03/2018   Procedure: CATARACT EXTRACTION PHACO AND INTRAOCULAR LENS PLACEMENT ([facility])  RIGHT;  Surgeon: Birder Robson, MD;  Location: [place];  Service: Ophthalmology;  Laterality: Right;  . CORONARY ANGIOPLASTY     STENT X 2  . HERNIA REPAIR    . JOINT REPLACEMENT  05/2018   TKR  . KNEE ARTHROPLASTY Left 06/03/2018   Procedure: COMPUTER ASSISTED TOTAL KNEE ARTHROPLASTY;  Surgeon: Dereck Leep, MD;  Location: [facility] ORS;  Service: Orthopedics;  Laterality:  Left;  . SEPTOPLASTY    . SKIN CANCER EXCISION    . THYROID SURGERY      Home Medications:  Allergies as of 01/09/2019      Reactions   Metoprolol Shortness Of Breath   Hyperactivity    Meperidine Other (See Comments)   "I felt like I was leaving this world." Confusion   Doxycycline Other (See Comments)   Difficulty sleeping, felt like his heart was beating in his stomach   Penicillins Itching, Rash, Other (See Comments)   Has patient had a PCN reaction causing immediate rash, facial/tongue/throat swelling, SOB or lightheadedness with hypotension: Unknown Has patient had a PCN reaction causing severe rash involving mucus membranes or skin necrosis: No Has patient had a PCN reaction that required hospitalization: Yes Has patient had a PCN reaction occurring within the last 10 years: No If all of the above answers are "NO", then may proceed with Cephalosporin use.      Medication List       Accurate as of January 09, 2019  3:35 PM. If you have any questions, ask your nurse or doctor.        STOP taking these medications   sulfamethoxazole-trimethoprim 400-80 MG tablet Commonly known as: BACTRIM Stopped by: Abbie Sons, MD   tamsulosin 0.4 MG Caps capsule Commonly known as: FLOMAX Stopped by: Abbie Sons, MD     TAKE these medications   acetaminophen 500 MG tablet Commonly known as:  TYLENOL Take 500-1,000 mg by mouth every 6 (six) hours as needed (for pain.).   ALPRAZolam 0.5 MG tablet Commonly known as: XANAX Take 0.5 mg by mouth at bedtime as needed for anxiety or sleep.   aspirin EC 81 MG tablet Take 81 mg by mouth daily.   carvedilol 6.25 MG tablet Commonly known as: COREG Take 6.25 mg by mouth 2 (two) times daily.   cloNIDine 0.1 MG tablet Commonly known as: CATAPRES Take 0.1 mg by mouth 2 (two) times daily.   diltiazem 240 MG 24 hr capsule Commonly known as: CARDIZEM CD Take 240 mg by mouth daily.   fluticasone 50 MCG/ACT nasal spray Commonly  known as: FLONASE Place 2 sprays into both nostrils at bedtime as needed for allergies or rhinitis.   gabapentin 100 MG capsule Commonly known as: NEURONTIN Take 100-300 mg by mouth See admin instructions. Take 1 capsule (100 mg) in the morning & take 3 capsules (300 mg) by mouth at night.   loratadine 10 MG tablet Commonly known as: CLARITIN Take 10 mg by mouth daily as needed (nasal allergies.).   Lubricant Eye Drops 0.4-0.3 % Soln Generic drug: Polyethyl Glycol-Propyl Glycol Place 1 drop into both eyes 4 (four) times daily as needed (dry/irritated eyes.).   Multi-Vitamin tablet Take by mouth.   multivitamin with minerals tablet Take by mouth.   neomycin-polymyxin-dexamethasone 0.1 % ophthalmic suspension Commonly known as: MAXITROL Place 1 drop into the right eye 3 (three) times daily.   NON FORMULARY Place 1 drop into the left eye 2 (two) times daily. PREDNISOLONE-GATIFLOX-BROMFENAC   psyllium 0.52 g capsule Commonly known as: REGULOID Take 0.52 g by mouth daily. Metamucil capsule   saw palmetto 500 MG capsule Take 500 mg by mouth daily.   tobramycin-dexamethasone ophthalmic solution Commonly known as: TOBRADEX   triamcinolone cream 0.1 % Commonly known as: KENALOG Apply 1 application topically daily as needed (for itching).       Allergies:  Allergies  Allergen Reactions  . Metoprolol Shortness Of Breath    Hyperactivity   . Meperidine Other (See Comments)    "I felt like I was leaving this world." Confusion  . Doxycycline Other (See Comments)    Difficulty sleeping, felt like his heart was beating in his stomach  . Penicillins Itching, Rash and Other (See Comments)    Has patient had a PCN reaction causing immediate rash, facial/tongue/throat swelling, SOB or lightheadedness with hypotension: Unknown Has patient had a PCN reaction causing severe rash involving mucus membranes or skin necrosis: No Has patient had a PCN reaction that required  hospitalization: Yes Has patient had a PCN reaction occurring within the last 10 years: No If all of the above answers are "NO", then may proceed with Cephalosporin use.     Family History: History reviewed. No pertinent family history.  Social History:  reports that he quit smoking about 39 years ago. He has never used smokeless tobacco. He reports that he does not drink alcohol or use drugs.  ROS: UROLOGY Frequent Urination?: Yes Hard to postpone urination?: Yes Burning/pain with urination?: No Get up at night to urinate?: Yes Leakage of urine?: No Urine stream starts and stops?: Yes Trouble starting stream?: No Do you have to strain to urinate?: No Blood in urine?: No Urinary tract infection?: No Sexually transmitted disease?: No Injury to kidneys or bladder?: No Painful intercourse?: No Weak stream?: Yes Erection problems?: No Penile pain?: No  Gastrointestinal Nausea?: No Vomiting?: No Indigestion/heartburn?: No Diarrhea?: No Constipation?:  Yes  Constitutional Fever: No Night sweats?: No Weight loss?: No Fatigue?: No  Skin Skin rash/lesions?: No Itching?: No  Eyes Blurred vision?: No Double vision?: No  Ears/Nose/Throat Sore throat?: No Sinus problems?: Yes  Hematologic/Lymphatic Swollen glands?: No Easy bruising?: No  Cardiovascular Leg swelling?: No Chest pain?: No  Respiratory Cough?: No Shortness of breath?: No  Endocrine Excessive thirst?: No  Musculoskeletal Back pain?: Yes Joint pain?: Yes  Neurological Headaches?: No Dizziness?: No  Psychologic Depression?: No Anxiety?: No  Physical Exam: BP (!) 170/103 (BP Location: Left Arm, Patient Position: Sitting, Cuff Size: Large)   Pulse 99   Ht 5\' 11"  (1.803 m)   Wt 268 lb (121.6 kg)   BMI 37.38 kg/m   Constitutional:  Alert and oriented, No acute distress. HEENT: [place] AT, moist mucus membranes.  Trachea midline, no masses. Cardiovascular: No clubbing, cyanosis, or edema.  Respiratory: Normal respiratory effort, no increased work of breathing. Skin: No rashes, bruises or suspicious lesions. Neurologic: Grossly intact, no focal deficits, moving all 4 extremities. Psychiatric: Normal mood and affect.   Assessment & Plan:   68 year old male with BPH and moderate lower urinary tract symptoms which he states are not bothersome.  He has been unable to tolerate tamsulosin.  I discussed a 5-ARI however he does not desire to start medication.  He was also not interested in outlet procedures.  He will call back if his voiding symptoms worsen otherwise follow-up in 6 months.    Abbie Sons, [place] [address], [place], [place] [address] [phone]

## 2019-04-11 ENCOUNTER — Other Ambulatory Visit: Admission: RE | Admit: 2019-04-11 | Payer: Medicare HMO | Source: Ambulatory Visit

## 2019-04-11 ENCOUNTER — Other Ambulatory Visit: Payer: Self-pay

## 2019-04-11 ENCOUNTER — Other Ambulatory Visit
Admission: RE | Admit: 2019-04-11 | Discharge: 2019-04-11 | Disposition: A | Discharge status: Home / Self Care | Payer: Medicare HMO | Source: Ambulatory Visit | Attending: Cardiology | Admitting: Cardiology

## 2019-04-11 DIAGNOSIS — Z01812 Encounter for preprocedural laboratory examination: Secondary | ICD-10-CM | POA: Insufficient documentation

## 2019-04-11 DIAGNOSIS — Z20828 Contact with and (suspected) exposure to other viral communicable diseases: Secondary | ICD-10-CM | POA: Insufficient documentation

## 2019-04-11 LAB — SARS CORONAVIRUS 2 (TAT 6-24 HRS): SARS Coronavirus 2: NEGATIVE

## 2019-04-15 ENCOUNTER — Observation Stay
Admission: RE | Admit: 2019-04-15 | Discharge: 2019-04-16 | Disposition: A | Discharge status: Home / Self Care | Payer: Medicare HMO | Source: Ambulatory Visit | Attending: Cardiology | Admitting: Cardiology

## 2019-04-15 ENCOUNTER — Encounter
Admission: RE | Disposition: A | Discharge status: Home / Self Care | Payer: Self-pay | Source: Ambulatory Visit | Attending: Cardiology

## 2019-04-15 ENCOUNTER — Other Ambulatory Visit: Payer: Self-pay

## 2019-04-15 DIAGNOSIS — Z881 Allergy status to other antibiotic agents status: Secondary | ICD-10-CM | POA: Insufficient documentation

## 2019-04-15 DIAGNOSIS — Z7982 Long term (current) use of aspirin: Secondary | ICD-10-CM | POA: Insufficient documentation

## 2019-04-15 DIAGNOSIS — I447 Left bundle-branch block, unspecified: Secondary | ICD-10-CM | POA: Insufficient documentation

## 2019-04-15 DIAGNOSIS — I10 Essential (primary) hypertension: Secondary | ICD-10-CM | POA: Diagnosis not present

## 2019-04-15 DIAGNOSIS — Z880 Allergy status to penicillin: Secondary | ICD-10-CM | POA: Diagnosis not present

## 2019-04-15 DIAGNOSIS — Z8249 Family history of ischemic heart disease and other diseases of the circulatory system: Secondary | ICD-10-CM | POA: Diagnosis not present

## 2019-04-15 DIAGNOSIS — Z79899 Other long term (current) drug therapy: Secondary | ICD-10-CM | POA: Insufficient documentation

## 2019-04-15 DIAGNOSIS — I2510 Atherosclerotic heart disease of native coronary artery without angina pectoris: Secondary | ICD-10-CM | POA: Diagnosis not present

## 2019-04-15 DIAGNOSIS — Z87891 Personal history of nicotine dependence: Secondary | ICD-10-CM | POA: Diagnosis not present

## 2019-04-15 DIAGNOSIS — H8101 Meniere's disease, right ear: Secondary | ICD-10-CM | POA: Insufficient documentation

## 2019-04-15 DIAGNOSIS — Z888 Allergy status to other drugs, medicaments and biological substances status: Secondary | ICD-10-CM | POA: Insufficient documentation

## 2019-04-15 DIAGNOSIS — R931 Abnormal findings on diagnostic imaging of heart and coronary circulation: Secondary | ICD-10-CM | POA: Insufficient documentation

## 2019-04-15 DIAGNOSIS — G4733 Obstructive sleep apnea (adult) (pediatric): Secondary | ICD-10-CM | POA: Insufficient documentation

## 2019-04-15 HISTORY — PX: LEFT HEART CATH AND CORONARY ANGIOGRAPHY: CATH118249

## 2019-04-15 HISTORY — PX: CORONARY STENT INTERVENTION: CATH118234

## 2019-04-15 LAB — POCT ACTIVATED CLOTTING TIME: Activated Clotting Time: 301 seconds

## 2019-04-15 LAB — GLUCOSE, CAPILLARY: Glucose-Capillary: 262 mg/dL — ABNORMAL HIGH (ref 70–99)

## 2019-04-15 LAB — MRSA PCR SCREENING: MRSA by PCR: NEGATIVE

## 2019-04-15 SURGERY — LEFT HEART CATH AND CORONARY ANGIOGRAPHY
Anesthesia: Moderate Sedation

## 2019-04-15 MED ORDER — SODIUM CHLORIDE 0.9 % IV SOLN: 250.0000 mL | INTRAVENOUS | Status: DC | PRN

## 2019-04-15 MED ORDER — HEPARIN SODIUM (PORCINE) 1000 UNIT/ML IJ SOLN
INTRAMUSCULAR | Status: AC
  Filled 2019-04-15: qty 1

## 2019-04-15 MED ORDER — SODIUM CHLORIDE 0.9% FLUSH: 3.0000 mL | INTRAVENOUS | Status: DC | PRN

## 2019-04-15 MED ORDER — CARVEDILOL 6.25 MG PO TABS
6.2500 mg | ORAL_TABLET | Freq: Two times a day (BID) | ORAL | Status: DC
  Administered 2019-04-15 – 2019-04-16 (×2): 6.25 mg via ORAL
  Filled 2019-04-15 (×2): qty 1

## 2019-04-15 MED ORDER — TICAGRELOR 90 MG PO TABS
ORAL_TABLET | ORAL | Status: DC | PRN
  Administered 2019-04-15: 180 mg via ORAL

## 2019-04-15 MED ORDER — SODIUM CHLORIDE 0.9 % WEIGHT BASED INFUSION
1.0000 mL/kg/h | INTRAVENOUS | Status: AC
  Administered 2019-04-15 (×2): 1 mL/kg/h via INTRAVENOUS

## 2019-04-15 MED ORDER — HEPARIN SODIUM (PORCINE) 1000 UNIT/ML IJ SOLN
INTRAMUSCULAR | Status: DC | PRN
  Administered 2019-04-15: 7000 [IU] via INTRAVENOUS
  Administered 2019-04-15: 5000 [IU] via INTRAVENOUS

## 2019-04-15 MED ORDER — MIDAZOLAM HCL 2 MG/2ML IJ SOLN
INTRAMUSCULAR | Status: AC
  Filled 2019-04-15: qty 2

## 2019-04-15 MED ORDER — TICAGRELOR 90 MG PO TABS
90.0000 mg | ORAL_TABLET | Freq: Two times a day (BID) | ORAL | Status: DC
  Administered 2019-04-15 – 2019-04-16 (×2): 90 mg via ORAL
  Filled 2019-04-15 (×2): qty 1

## 2019-04-15 MED ORDER — ACETAMINOPHEN 325 MG PO TABS: 650.0000 mg | ORAL_TABLET | ORAL | Status: DC | PRN

## 2019-04-15 MED ORDER — LOSARTAN POTASSIUM 25 MG PO TABS
25.0000 mg | ORAL_TABLET | Freq: Every day | ORAL | Status: DC
  Administered 2019-04-16: 25 mg via ORAL
  Filled 2019-04-15 (×3): qty 1

## 2019-04-15 MED ORDER — DILTIAZEM HCL ER COATED BEADS 120 MG PO CP24
240.0000 mg | ORAL_CAPSULE | Freq: Every day | ORAL | Status: DC
  Administered 2019-04-16: 240 mg via ORAL
  Filled 2019-04-15: qty 2
  Filled 2019-04-15 (×2): qty 1

## 2019-04-15 MED ORDER — ASPIRIN 81 MG PO CHEW
CHEWABLE_TABLET | ORAL | Status: AC
  Filled 2019-04-15: qty 3

## 2019-04-15 MED ORDER — MIDAZOLAM HCL 2 MG/2ML IJ SOLN
INTRAMUSCULAR | Status: DC | PRN
  Administered 2019-04-15 (×3): 1 mg via INTRAVENOUS

## 2019-04-15 MED ORDER — TICAGRELOR 90 MG PO TABS
ORAL_TABLET | ORAL | Status: AC
  Filled 2019-04-15: qty 2

## 2019-04-15 MED ORDER — VERAPAMIL HCL 2.5 MG/ML IV SOLN
INTRAVENOUS | Status: AC
  Filled 2019-04-15: qty 2

## 2019-04-15 MED ORDER — FLUTICASONE PROPIONATE 50 MCG/ACT NA SUSP
2.0000 | Freq: Every day | NASAL | Status: DC
  Filled 2019-04-15: qty 16

## 2019-04-15 MED ORDER — CHLORHEXIDINE GLUCONATE CLOTH 2 % EX PADS
6.0000 | MEDICATED_PAD | Freq: Every day | CUTANEOUS | Status: DC
  Administered 2019-04-15: 6 via TOPICAL

## 2019-04-15 MED ORDER — ONDANSETRON HCL 4 MG/2ML IJ SOLN: 4.0000 mg | Freq: Four times a day (QID) | INTRAMUSCULAR | Status: DC | PRN

## 2019-04-15 MED ORDER — ALPRAZOLAM 0.5 MG PO TABS: 0.5000 mg | ORAL_TABLET | Freq: Every evening | ORAL | Status: DC | PRN

## 2019-04-15 MED ORDER — GABAPENTIN 100 MG PO CAPS
200.0000 mg | ORAL_CAPSULE | Freq: Two times a day (BID) | ORAL | Status: DC
  Administered 2019-04-15 – 2019-04-16 (×2): 200 mg via ORAL
  Filled 2019-04-15 (×3): qty 2

## 2019-04-15 MED ORDER — FENTANYL CITRATE (PF) 100 MCG/2ML IJ SOLN
INTRAMUSCULAR | Status: AC
  Filled 2019-04-15: qty 2

## 2019-04-15 MED ORDER — ASPIRIN 81 MG PO CHEW
CHEWABLE_TABLET | ORAL | Status: DC | PRN
  Administered 2019-04-15: 243 mg via ORAL

## 2019-04-15 MED ORDER — HEPARIN (PORCINE) IN NACL 1000-0.9 UT/500ML-% IV SOLN
INTRAVENOUS | Status: AC
  Filled 2019-04-15: qty 1000

## 2019-04-15 MED ORDER — SODIUM CHLORIDE 0.9% FLUSH
3.0000 mL | Freq: Two times a day (BID) | INTRAVENOUS | Status: DC
  Administered 2019-04-15 – 2019-04-16 (×2): 3 mL via INTRAVENOUS

## 2019-04-15 MED ORDER — SODIUM CHLORIDE 0.9% FLUSH
3.0000 mL | Freq: Two times a day (BID) | INTRAVENOUS | Status: DC
  Administered 2019-04-16: 3 mL via INTRAVENOUS

## 2019-04-15 MED ORDER — FENTANYL CITRATE (PF) 100 MCG/2ML IJ SOLN
INTRAMUSCULAR | Status: DC | PRN
  Administered 2019-04-15: 50 ug via INTRAVENOUS
  Administered 2019-04-15 (×2): 25 ug via INTRAVENOUS

## 2019-04-15 MED ORDER — ASPIRIN 81 MG PO CHEW: 81.0000 mg | CHEWABLE_TABLET | ORAL | Status: DC

## 2019-04-15 MED ORDER — SODIUM CHLORIDE 0.9 % IV SOLN
INTRAVENOUS | Status: DC
  Administered 2019-04-15: 08:00:00 via INTRAVENOUS

## 2019-04-15 MED ORDER — HEPARIN (PORCINE) IN NACL 1000-0.9 UT/500ML-% IV SOLN
INTRAVENOUS | Status: DC | PRN
  Administered 2019-04-15: 500 mL

## 2019-04-15 MED ORDER — HYDRALAZINE HCL 20 MG/ML IJ SOLN: 10.0000 mg | INTRAMUSCULAR | Status: AC | PRN

## 2019-04-15 MED ORDER — ASPIRIN 81 MG PO CHEW
81.0000 mg | CHEWABLE_TABLET | Freq: Every day | ORAL | Status: DC
  Administered 2019-04-16: 09:00:00 81 mg via ORAL
  Filled 2019-04-15: qty 1

## 2019-04-15 MED ORDER — TAMSULOSIN HCL 0.4 MG PO CAPS
0.4000 mg | ORAL_CAPSULE | Freq: Every day | ORAL | Status: DC
  Filled 2019-04-15: qty 1

## 2019-04-15 MED ORDER — IOHEXOL 300 MG/ML  SOLN
INTRAMUSCULAR | Status: DC | PRN
  Administered 2019-04-15: 10:00:00 210 mL

## 2019-04-15 MED ORDER — VERAPAMIL HCL 2.5 MG/ML IV SOLN
INTRAVENOUS | Status: DC | PRN
  Administered 2019-04-15: 2.5 mg via INTRA_ARTERIAL

## 2019-04-15 SURGICAL SUPPLY — 15 items
BALLN TREK RX 2.5X12 (BALLOONS) ×4
BALLN ~~LOC~~ TREK RX 4.0X8 (BALLOONS) ×4
BALLOON TREK RX 2.5X12 (BALLOONS) ×2 IMPLANT
BALLOON ~~LOC~~ TREK RX 4.0X8 (BALLOONS) ×2 IMPLANT
CATH 5F 110X4 TIG (CATHETERS) ×4 IMPLANT
CATH INFINITI 5FR ANG PIGTAIL (CATHETERS) ×4 IMPLANT
CATH VISTA GUIDE 6FR JR4 SH (CATHETERS) ×4 IMPLANT
DEVICE INFLAT 30 PLUS (MISCELLANEOUS) ×4 IMPLANT
DEVICE RAD TR BAND REGULAR (VASCULAR PRODUCTS) ×4 IMPLANT
GLIDESHEATH SLEND SS 6F .021 (SHEATH) ×4 IMPLANT
KIT MANI 3VAL PERCEP (MISCELLANEOUS) ×4 IMPLANT
PACK CARDIAC CATH (CUSTOM PROCEDURE TRAY) ×4 IMPLANT
STENT RESOLUTE ONYX 3.5X12 (Permanent Stent) ×4 IMPLANT
WIRE ASAHI PROWATER 180CM (WIRE) ×4 IMPLANT
WIRE ROSEN-J .035X260CM (WIRE) ×4 IMPLANT

## 2019-04-16 ENCOUNTER — Encounter: Payer: Self-pay | Admitting: *Deleted

## 2019-04-16 ENCOUNTER — Encounter: Payer: Self-pay | Admitting: Physician Assistant

## 2019-04-16 DIAGNOSIS — I2510 Atherosclerotic heart disease of native coronary artery without angina pectoris: Secondary | ICD-10-CM | POA: Diagnosis not present

## 2019-04-16 LAB — BASIC METABOLIC PANEL
Anion gap: 12 (ref 5–15)
BUN: 11 mg/dL (ref 8–23)
CO2: 19 mmol/L — ABNORMAL LOW (ref 22–32)
Calcium: 8.5 mg/dL — ABNORMAL LOW (ref 8.9–10.3)
Chloride: 106 mmol/L (ref 98–111)
Creatinine, Ser: 0.7 mg/dL (ref 0.61–1.24)
GFR calc Af Amer: >60 mL/min (ref >60)
GFR calc non Af Amer: >60 mL/min (ref >60)
Glucose, Bld: 207 mg/dL — ABNORMAL HIGH (ref 70–99)
Potassium: 3.8 mmol/L (ref 3.5–5.1)
Sodium: 137 mmol/L (ref 135–145)

## 2019-04-16 LAB — CBC
HCT: 42.5 % (ref 39.0–52.0)
Hemoglobin: 14.6 g/dL (ref 13.0–17.0)
MCH: 29.7 pg (ref 26.0–34.0)
MCHC: 34.4 g/dL (ref 30.0–36.0)
MCV: 86.6 fL (ref 80.0–100.0)
Platelets: 200 10*3/uL (ref 150–400)
RBC: 4.91 MIL/uL (ref 4.22–5.81)
RDW: 12.6 % (ref 11.5–15.5)
WBC: 11.9 10*3/uL — ABNORMAL HIGH (ref 4.0–10.5)
nRBC: 0 % (ref 0.0–0.2)

## 2019-04-16 MED ORDER — TICAGRELOR 90 MG PO TABS: 90.0000 mg | ORAL_TABLET | Freq: Two times a day (BID) | ORAL | 11 refills | Status: DC

## 2019-04-16 NOTE — Progress Notes (Signed)
Pt has had no pain throughout shift. Vitals are stable, on room air. Pt wearing cpap at night for sleep. Morning EKG completed. Pt transferred to 238 at 0515 via wheelchair with all of belongings. Report given via phone to [facility], Therapist, sports.

## 2019-04-16 NOTE — Discharge Summary (Signed)
Physician Discharge Summary  Patient ID: Jackson LIVAUDAIS Sr. MRN: [id number] DOB/AGE: 1951/02/21 68 y.o.  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 Right pulmonary nodules were also observed with a recommendation to repeat CT scan in 3 to 6 months. The patient elected for cardiac catheterization for further evaluation on 04/15/2019, which revealed two-vessel coronary artery disease with 75% small caliber mid LAD involving takeoff of large caliber D1, and 95% stenosis ostial RCA with mildly reduced left ventricular function with global hypokinesis, with LVEF 40%. The patient underwent successful PCI with a Resolute Onyx 3.5 x 12 mm DES to ostial RCA. The patient denies chest pain, shortness of breath, or pain at right radial. The patient has ambulated in his room without difficulty and is eager to go home. ECG this morning reveals sinus rhythm at a rate of 81 bpm with LBBB.    Discharge Exam: Blood pressure (!) 142/96, pulse 86, temperature 98.1 F (36.7 C), resp. rate 18, height 5\' 10"  (1.778 m), weight 121.7 kg, SpO2 98 %.   General appearance: alert, cooperative, appears stated age and no distress Head: Normocephalic, without obvious abnormality, atraumatic Eyes: negative Neck: no JVD and supple, symmetrical, trachea midline Back: negative Resp: normal effort of breathing on room air. Cardio: regular rate and rhythm Skin: warm, dry, no diaphoresis Incision/Wound:right wrist with intact bandage without active bleeding or dried  blood Labs:   Lab Results  Component Value Date   WBC 11.9 (H) 04/16/2019   HGB 14.6 04/16/2019   HCT 42.5 04/16/2019   MCV 86.6 04/16/2019   PLT 200 04/16/2019    Recent Labs  Lab 04/16/19 0503  NA 137  K 3.8  CL 106  CO2 19*  BUN 11  CREATININE 0.70  CALCIUM 8.5*  GLUCOSE 207*       EKG: sinus rhythm, LBBB  FOLLOW UP PLANS AND APPOINTMENTS Discharge Instructions    AMB Referral to Cardiac Rehabilitation - Phase II   Complete by: As directed    Diagnosis: Coronary Stents   After initial evaluation and assessments completed: Virtual Based Care may be provided alone or in conjunction with Phase 2 Cardiac Rehab based on patient barriers.: Yes     Allergies as of 04/16/2019      Reactions   Metoprolol Shortness Of Breath   Hyperactivity    Meperidine Other (See Comments)   "I felt like I was leaving this world." Confusion   Doxycycline Palpitations   Difficulty sleeping, felt like his heart was beating in his stomach   Penicillins Itching, Rash, Other (See Comments)   Did it involve swelling of the face/tongue/throat, SOB, or low BP? No Did it involve sudden or severe rash/hives, skin peeling, or any reaction on the inside of your mouth or nose? No Did you need to seek medical attention at a hospital or doctor's office? No When did it last happen?30 Years If all above answers are "NO", may proceed with cephalosporin use.      Medication List    TAKE these medications   acetaminophen 500 MG tablet Commonly known as: TYLENOL Take 500 mg by mouth daily as needed for  moderate pain (for pain.).   ALPRAZolam 0.5 MG tablet Commonly known as: XANAX Take 0.5 mg by mouth at bedtime as needed for anxiety or sleep.   aspirin EC 81 MG tablet Take 81 mg by mouth daily.   carvedilol 6.25 MG tablet Commonly known as: COREG Take 6.25 mg by mouth 2 (two) times daily.   diltiazem 240 MG 24 hr capsule Commonly known as: CARDIZEM CD Take 240 mg by mouth daily.    fluticasone 50 MCG/ACT nasal spray Commonly known as: FLONASE Place 2 sprays into both nostrils at bedtime as needed for allergies or rhinitis.   gabapentin 100 MG capsule Commonly known as: NEURONTIN Take 100-300 mg by mouth See admin instructions. Take 1 capsule (100 mg) in the morning & take 3 capsules (300 mg) by mouth at night.   losartan 25 MG tablet Commonly known as: COZAAR Take 25 mg by mouth daily.   Lubricant Eye Drops 0.4-0.3 % Soln Generic drug: Polyethyl Glycol-Propyl Glycol Place 1 drop into both eyes 4 (four) times daily as needed (dry/irritated eyes.).   methylcellulose oral powder Take 1 packet by mouth daily.   multivitamin with minerals tablet Take 1 tablet by mouth daily.   polyethylene glycol 17 g packet Commonly known as: MIRALAX / GLYCOLAX Take 17 g by mouth daily.   saw palmetto 500 MG capsule Take 500 mg by mouth daily.   ticagrelor 90 MG Tabs tablet Commonly known as: BRILINTA Take 1 tablet (90 mg total) by mouth 2 (two) times daily.   triamcinolone cream 0.1 % Commonly known as: KENALOG Apply 1 application topically daily as needed (for itching).      Follow-up Information    Paraschos, Alexander, MD. Go in 1 week(s).   Specialty: Cardiology Contact information: [facility] [place] [address] [phone]           BRING ALL MEDICATIONS WITH YOU TO FOLLOW UP APPOINTMENTS  Time spent with patient to include physician time: 30 minutes Signed:  Clabe Seal PA-C 04/16/2019, 8:43 AM

## 2019-04-16 NOTE — Plan of Care (Signed)
Education including radial site care & medication management Brilinta discussed with patient, wife & sister at discharge.

## 2019-04-16 NOTE — TOC Transition Note (Signed)
Transition of Care [facility]) - CM/SW Discharge Note   Patient Details  Name: Jackson CLEMMER Sr. MRN: [id number] Date of Birth: 06-03-1951  [facility] of [facility]) CM/SW Contact:  Elza Rafter, RN Phone Number: 04/16/2019, 8:33 AM   Clinical Narrative:   Patient discharging today.  30 day free Brilinta coupon provided.            Patient Goals and CMS Choice        Discharge Placement                       Discharge Plan and Services                                     Social Determinants of Health (SDOH) Interventions     Readmission Risk Interventions No flowsheet data found.

## 2019-04-16 NOTE — Progress Notes (Signed)
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 The patient presented to [facility] for outpatient Cardiac Cath on 04/15/2019.    Paraschos, Alexander, MD (Primary)    Procedures  CORONARY STENT INTERVENTION  LEFT HEART CATH AND CORONARY ANGIOGRAPHY  Conclusion    Mid LAD lesion is 75% stenosed.  Ost RCA lesion is 95% stenosed.  A drug-eluting stent was successfully placed using a STENT RESOLUTE ONYX 3.5X12.  Post intervention, there is a 0% residual stenosis.   1.  Two-vessel coronary artery disease with 75% small caliber mid LAD involving takeoff of large caliber D1, and 95% stenosis ostial RCA 2.  Mildly reduced left ventricular function, with global hypokinesis, estimated LV ejection fraction 40% 3.  Successful PCI, DES ostial RCA   Education:   Discussed the definition of CAD. Reviewed the location of CAD and where his stent was placed. Informed patient he will be given a stent card. Explained the purpose of the stent card. Instructed patient to keep stent card in his wallet.  ? Discussed modifiable risk factors including controlling blood pressure, cholesterol, and blood sugar; following heart healthy diet; maintaining healthy weight; exercise; and smoking cessation, if applicable.   ? Discussed cardiac medications including rationale for taking, mechanisms of action, and side effects. Stressed the importance of taking medications as prescribed.  ? Discussed emergency plan for heart attack symptoms. Patient verbalized understanding of need to call 911 and not to drive himself to ER if having cardiac symptoms / chest pain.   ? Diet of low sodium, low fat, low cholesterol heart healthy diet discussed.  Information on diet provided.  ? Smoking Cessation - Patient is a FORMER smoker.   ? Exercise - Benefits of exercised discussed.   Informed patient his cardiologist has referred him to outpatient Cardiac Rehab. An overview of the program was provided. Brochure, informational letter with CPT billing codes given to patient. Patient is interested in participating. Informed patient the Cardiac Rehab dept will contact him by phone in one to two weeks after discharge to schedule his first appointment.   ?  Patient appreciative of the information.  ? Roanna Epley, RN, BSN, [place]  [facility] Cardiac & Pulmonary Rehab  Cardiovascular & Pulmonary Nurse Navigator  Direct Line: [phone]  Department Phone #: [phone] Fax: [phone]  Email Address: [email or web address]

## 2019-04-16 NOTE — Plan of Care (Signed)
  Problem: Activity: Goal: Risk for activity intolerance will decrease Outcome: Progressing Note: Up independently in room, tolerating well   Problem: Nutrition: Goal: Adequate nutrition will be maintained Outcome: Progressing   Problem: Coping: Goal: Level of anxiety will decrease Outcome: Progressing   Problem: Pain Managment: Goal: General experience of comfort will improve Outcome: Progressing Note: No complaints of pain    Problem: Safety: Goal: Ability to remain free from injury will improve Outcome: Progressing   Problem: Skin Integrity: Goal: Risk for impaired skin integrity will decrease Outcome: Progressing   Problem: Cardiovascular: Goal: Vascular access site(s) Level 0-1 will be maintained Outcome: Progressing Note: Right wrist site level 0- no bleeding, bruising, or swelling    Problem: Education: Goal: Knowledge of General Education information will improve Description: Including pain rating scale, medication(s)/side effects and non-pharmacologic comfort measures Outcome: Completed/Met   Problem: Clinical Measurements: Goal: Respiratory complications will improve Outcome: Completed/Met

## 2019-04-16 NOTE — Discharge Instructions (Signed)
Avoid strenuous activity, heavy lifting, tugging, or pulling for 1 week.

## 2019-04-16 NOTE — Progress Notes (Signed)
Patient discharged via wheelchair to entrance with wife & sister. AVS discharge education reviewed with patient & family. Questions regarding medications asked & answered for Brilinta. Radial site care education performed follow AVS handout. No questions after education.

## 2019-05-01 ENCOUNTER — Other Ambulatory Visit: Payer: Self-pay | Admitting: Internal Medicine

## 2019-05-01 DIAGNOSIS — R918 Other nonspecific abnormal finding of lung field: Secondary | ICD-10-CM

## 2019-05-09 ENCOUNTER — Other Ambulatory Visit: Payer: Self-pay

## 2019-05-09 ENCOUNTER — Ambulatory Visit
Admission: RE | Admit: 2019-05-09 | Discharge: 2019-05-09 | Disposition: A | Discharge status: Home / Self Care | Payer: Medicare HMO | Source: Ambulatory Visit | Attending: Internal Medicine | Admitting: Internal Medicine

## 2019-05-09 DIAGNOSIS — R918 Other nonspecific abnormal finding of lung field: Secondary | ICD-10-CM | POA: Diagnosis present

## 2019-07-07 ENCOUNTER — Ambulatory Visit: Payer: Medicare HMO | Admitting: Urology

## 2019-07-15 ENCOUNTER — Ambulatory Visit: Payer: Medicare HMO | Admitting: Urology

## 2019-07-16 ENCOUNTER — Ambulatory Visit: Payer: Medicare HMO | Admitting: Urology

## 2019-08-08 ENCOUNTER — Ambulatory Visit: Payer: Medicare HMO | Admitting: Urology

## 2019-08-11 ENCOUNTER — Encounter: Payer: Self-pay | Admitting: Dermatology

## 2019-10-23 ENCOUNTER — Telehealth: Payer: Self-pay

## 2019-10-23 NOTE — Telephone Encounter (Signed)
We will send in Doxycycline (if he is not allergic) to take 1 po bid with food to start the day of surgery.  Dsp #14 no rf.  Send in now for pt and advise to take with food starting day of surgery

## 2019-10-23 NOTE — Telephone Encounter (Signed)
Patient called regarding his surgery on 11/04/19 to remove a severe dysplastic. He has had a knee replacement and wanted to confirm if you wanted him on any preventative antibiotics?

## 2019-10-24 NOTE — Telephone Encounter (Signed)
Call and remind him of this.  I would recommend he take with food and that may solve issue.  This is not a true "allergy". If he does not want to take doxycycline, then he can take: (if not allergic) Cephalexin 500 mg 1 po bid #14 no rf May send.

## 2019-10-24 NOTE — Telephone Encounter (Signed)
Called patient and advised him of information per Dr. Nehemiah Massed. When going to send in Doxycycline for excision a allergy popped up with Doxycycline that was entered in June 2019 stating "Difficulty sleeping, felt like his heart was beating in his stomach".  Do you want to change RX?

## 2019-10-27 NOTE — Telephone Encounter (Signed)
Left message for patient to return my call.

## 2019-10-28 MED ORDER — CEPHALEXIN 500 MG PO CAPS: 500.0000 mg | ORAL_CAPSULE | Freq: Two times a day (BID) | ORAL | 0 refills | Status: AC

## 2019-10-28 NOTE — Telephone Encounter (Signed)
Spoke with patient and he wants to take the Cephalexin and not risk the Doxycycline. Prescription sent in.

## 2019-11-04 ENCOUNTER — Encounter: Payer: Medicare HMO | Admitting: Dermatology

## 2019-11-10 ENCOUNTER — Ambulatory Visit: Payer: Medicare HMO | Admitting: Dermatology

## 2019-11-13 IMAGING — DX DG KNEE 1-2V PORT*L*
2 series · 2 of 2 positions shown · non-contrast
Comparison: None.

CLINICAL DATA: Total knee replacement

EXAM:
PORTABLE LEFT KNEE - 1-2 VIEW

[knee ap]
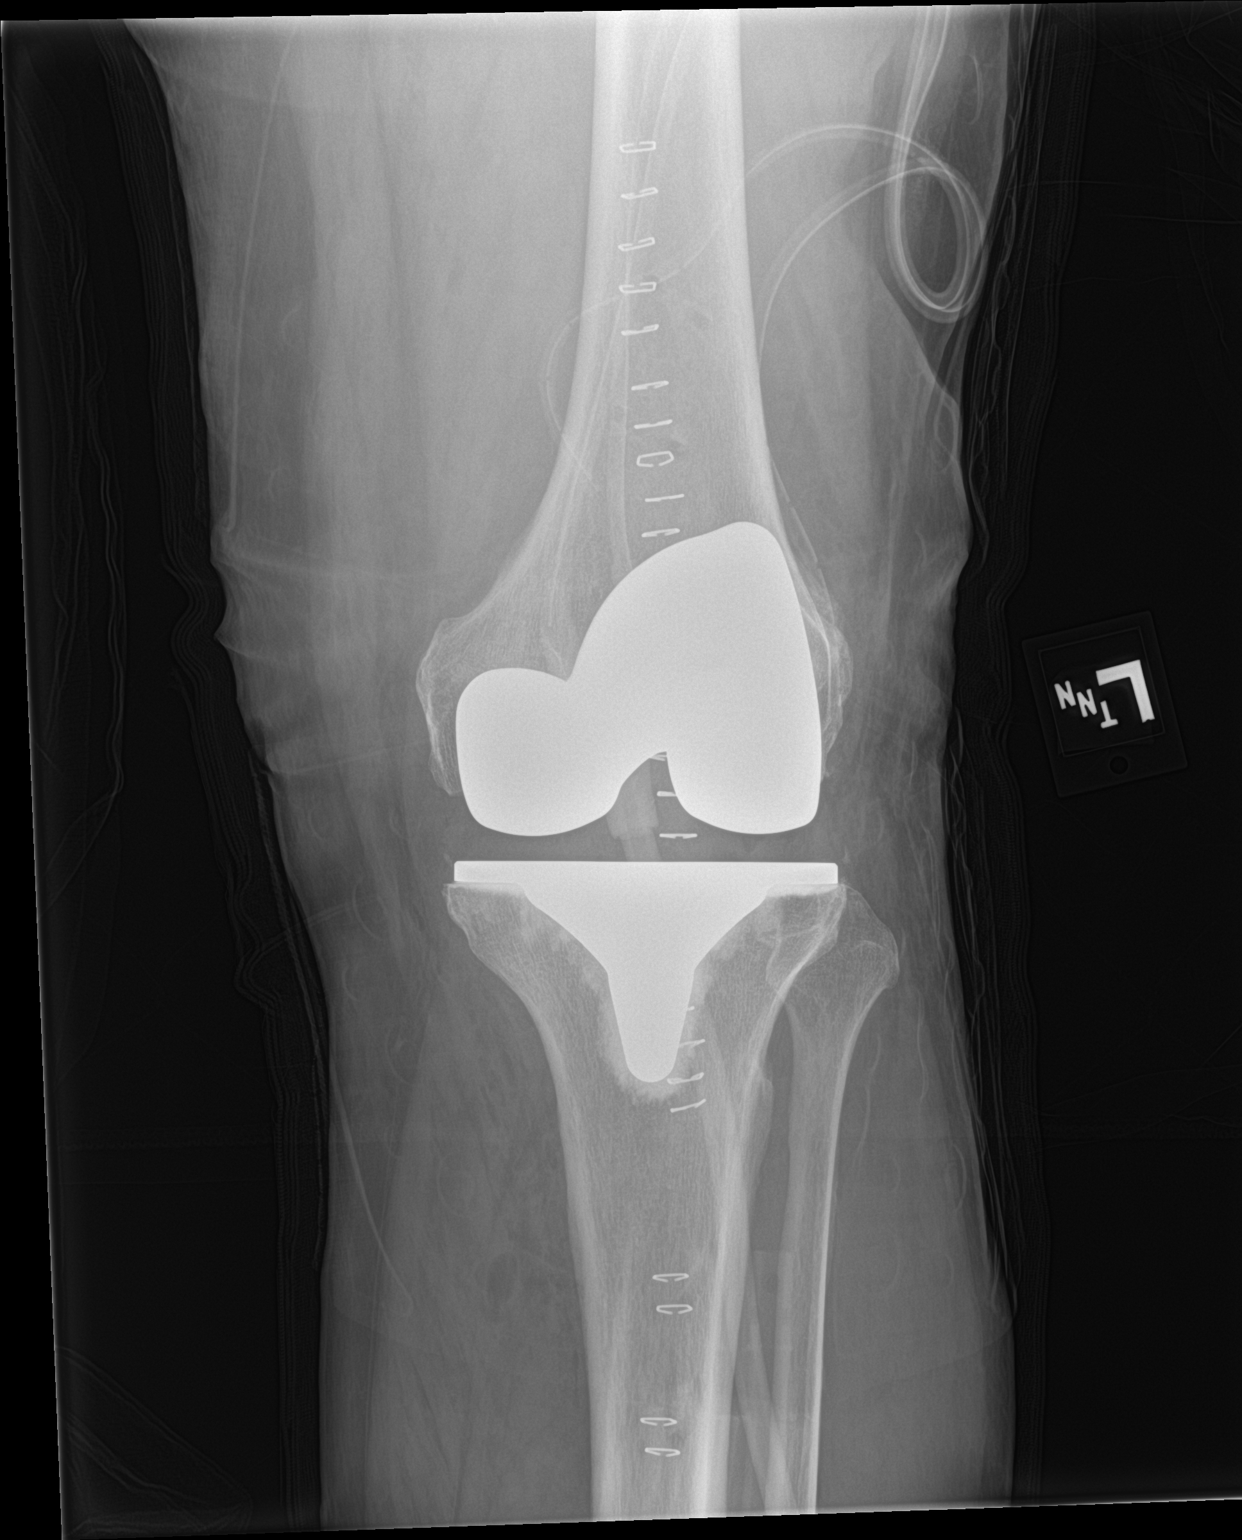

[knee lat]
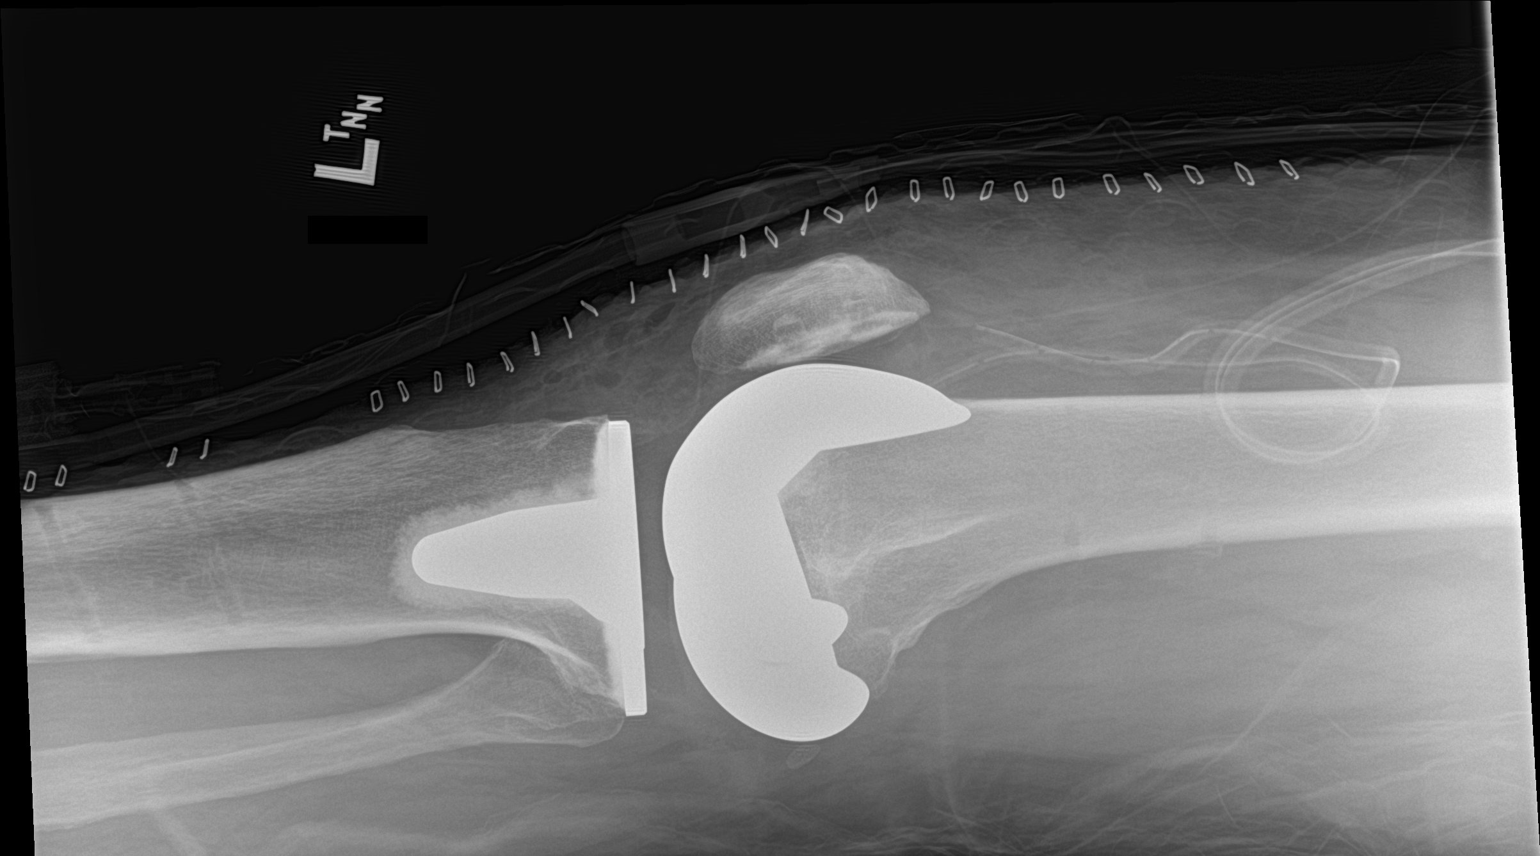

[2 of 2 positions shown; findings below may reference images not displayed]

FINDINGS: Total knee arthroplasty with well-seated prosthesis. No subluxation
or fracture.
IMPRESSION: Total knee arthroplasty without complicating feature.

## 2019-11-15 IMAGING — CT CT ANGIO CHEST
2 of 6 series · 17 of 46 positions shown · IV contrast (APPLIED)
Comparison: Chest radiograph - 06/04/2018

CLINICAL DATA: Knee surgery 2 days ago, now with shortness of
breath. Evaluate for pulmonary embolism.

EXAM:
CT ANGIOGRAPHY CHEST WITH CONTRAST
TECHNIQUE: Multidetector CT imaging of the chest was performed using the
standard protocol during bolus administration of intravenous
contrast. Multiplanar CT image reconstructions and MIPs were
obtained to evaluate the vascular anatomy.
CONTRAST:  75mL OMNIPAQUE IOHEXOL 350 MG/ML SOLN

[Series 5: thins · axial · 0.74mm/px · z∈[-1118,-840]mm · 14 of 306 slices shown]
[im 14/306  lung]
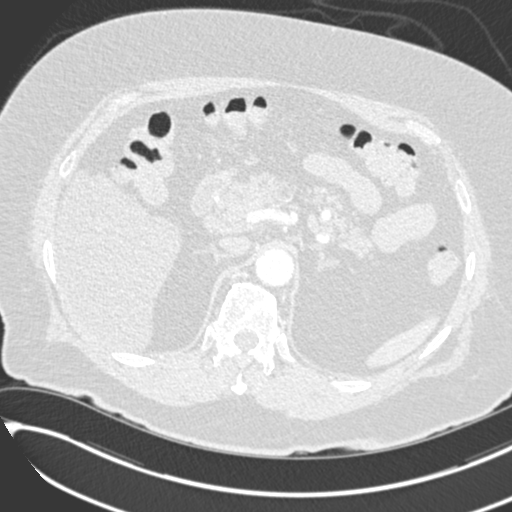
[im 40/306  soft-tissue]
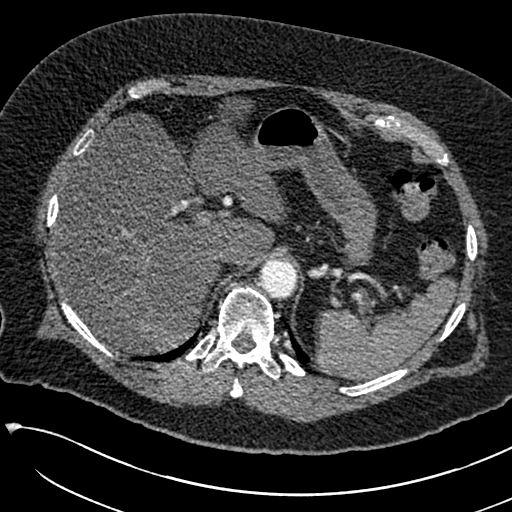
[im 54/306  lung]
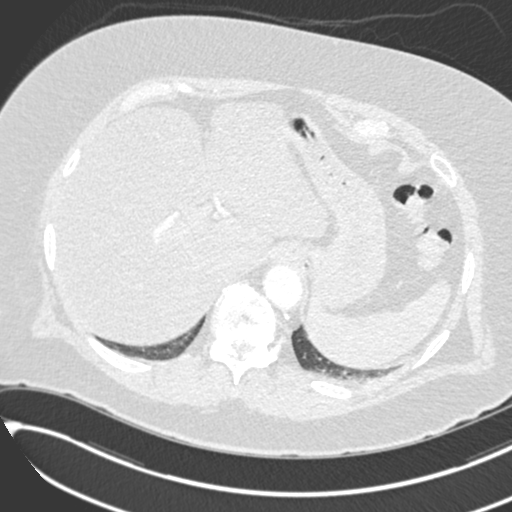
[im 80/306  soft-tissue]
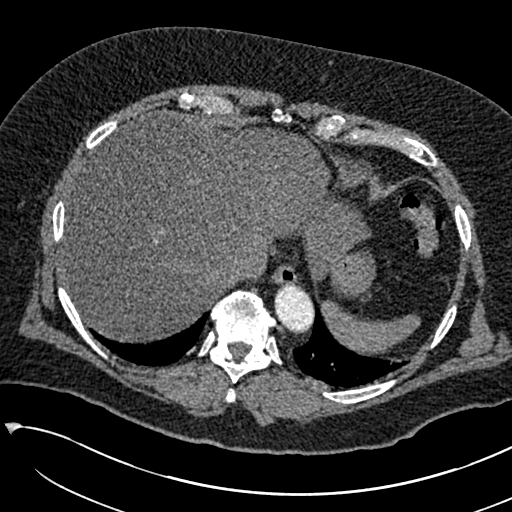
[im 107/306  lung]
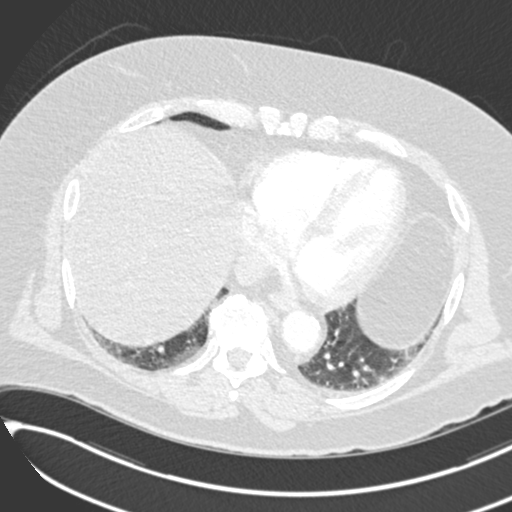
[im 120/306  soft-tissue]
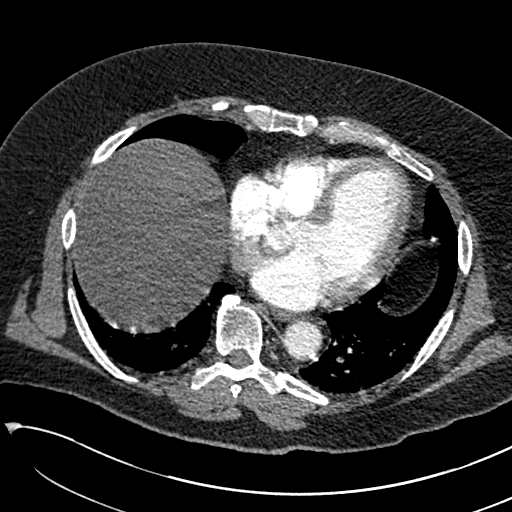
[im 146/306  lung]
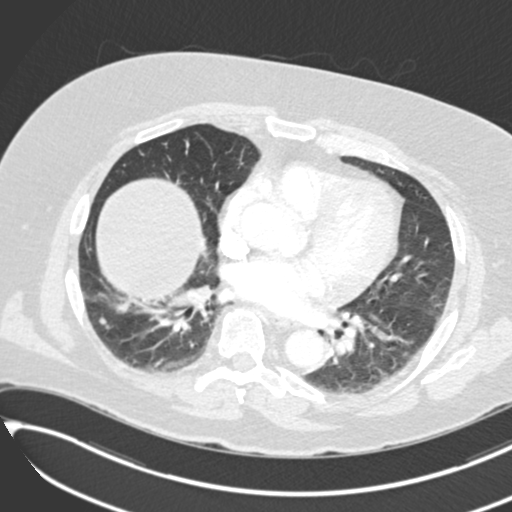
[im 160/306  soft-tissue]
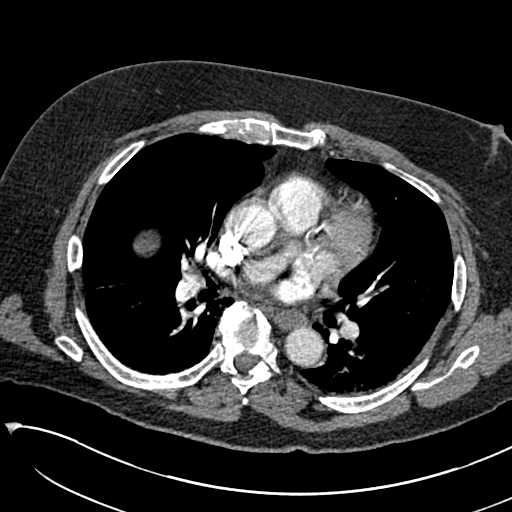
[im 186/306  lung]
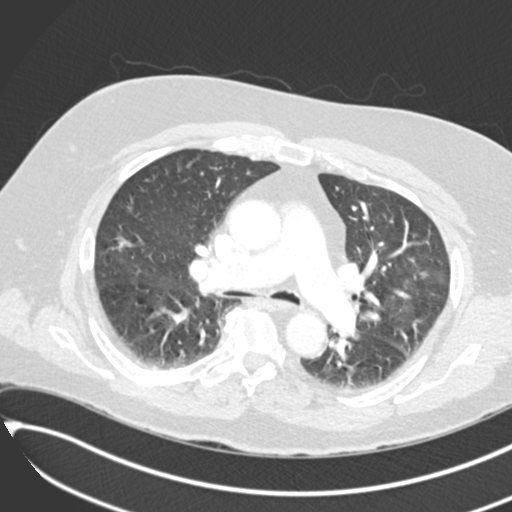
[im 199/306  soft-tissue]
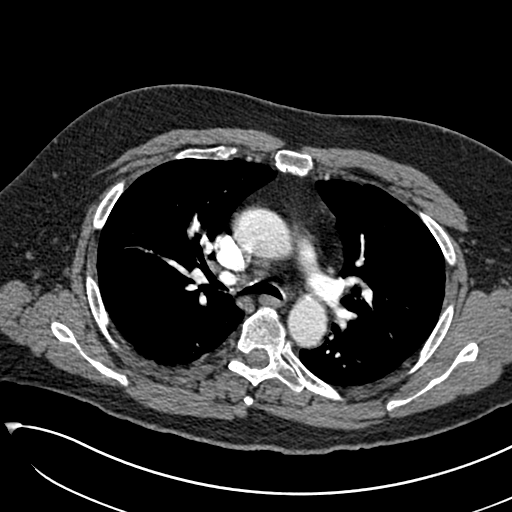
[im 226/306  lung]
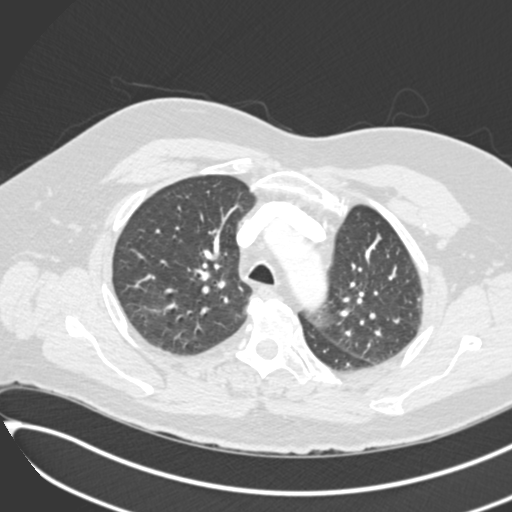
[im 252/306  soft-tissue]
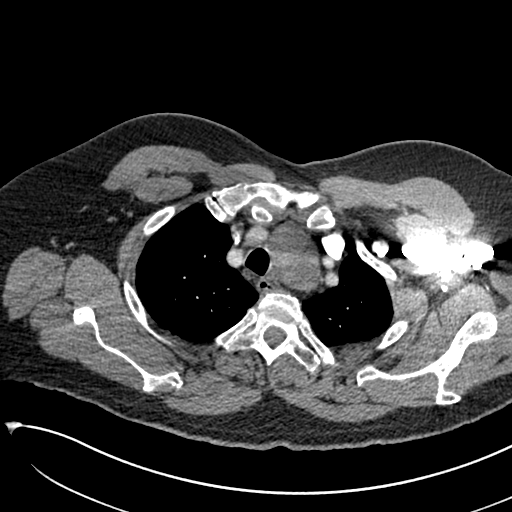
[im 266/306  lung]
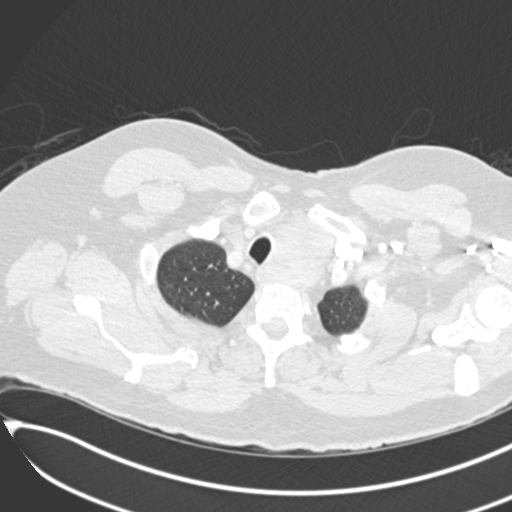
[im 292/306  soft-tissue]
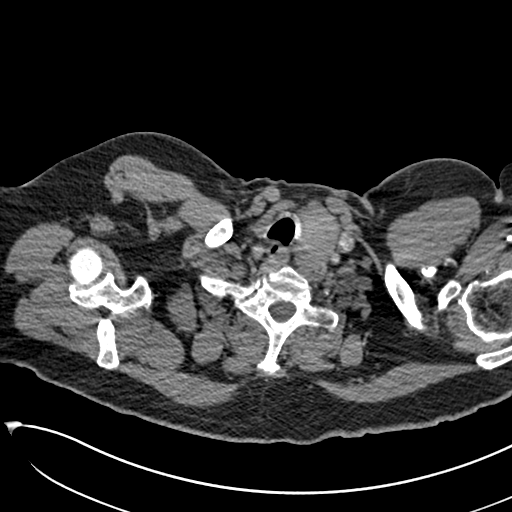

[Series 7: coronal mpr · coronal · 0.60mm/px · 3 of 97 slices shown]
[im 25/97  soft-tissue]
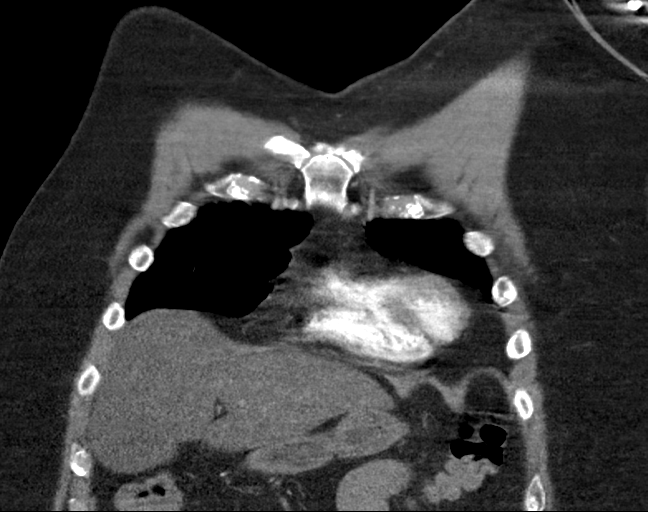
[im 49/97  soft-tissue]
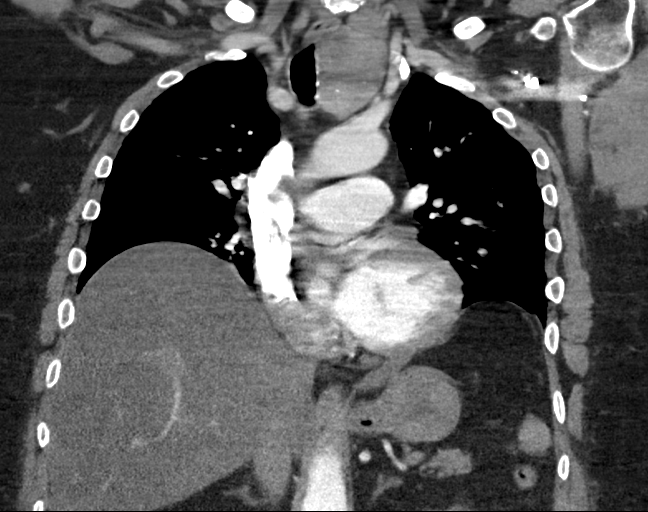
[im 73/97  soft-tissue]
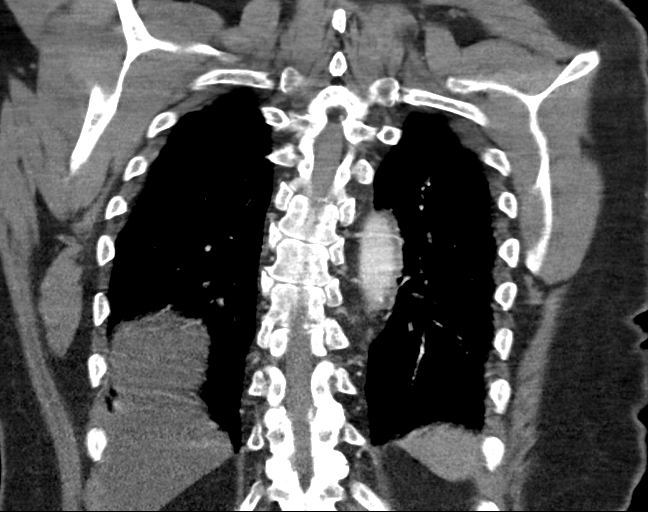

[17 of 46 positions shown; findings below may reference images not displayed]

FINDINGS: Vascular Findings:

There is suboptimal opacification of the pulmonary arterial system
with the main pulmonary artery measuring only 191 Hounsfield units.
Given this limitation, there no discrete filling defects within the
central pulmonary arterial tree to suggest central pulmonary
embolism. Evaluation of the distal subsegmental pulmonary arteries
is degraded secondary to a combination of suboptimal vessel
opacification, quantum mottle artifact due to patient body habitus
and patient respiratory artifact. Normal caliber of the main
pulmonary artery.

Normal heart size. Coronary artery calcifications. No pericardial
effusion.

No evidence of thoracic aortic aneurysm. No evidence of thoracic
aortic dissection or periaortic stranding on this nongated
examination. Conventional configuration of the aortic arch. The
branch vessels of the aortic arch appear widely patent throughout
their imaged courses.

Review of the MIP images confirms the above findings.

----------------------------------------------------------------------------------

Nonvascular Findings:

Mediastinum/Lymph Nodes: No bulky mediastinal, hilar or axillary
lymphadenopathy.

Lungs/Pleura: Multiple pulmonary nodules are seen bilaterally with
index right lower lobe pulmonary nodule measuring 6 mm in diameter
(image 54, series 6) and index left lower lobe pulmonary nodule
measuring 0.7 cm (image 55, series 6).

Additional pulmonary nodules are seen within the left upper (image
33) and lower lobes (image 59), right upper (images 26, 38, 39 and
44), middle (image 56) and lower lobes (images 53, 54 and 59).

Upper abdomen: Limited early arterial phase evaluation of the upper
abdomen demonstrates diffuse decreased attenuation of the hepatic
parenchyma suggestive of hepatic steatosis.

Musculoskeletal: No acute or aggressive osseous abnormalities.
Mild-to-moderate DDD of the lower thoracic spine. Marked asymmetric
enlargement of the right lobe of the thyroid.
IMPRESSION: 1. No acute cardiopulmonary disease. Specifically, no evidence of
pulmonary embolism to the level the bilateral subsegmental pulmonary
arteries.
2. Multiple bilateral pulmonary nodules, the largest of which within
the left lower lobe measures 7 mm in diameter, unchanged compared to
the [DATE] examination. Follow-up CT in 18-24 months is considered
optional for low-risk patients, but is recommended for high-risk
patients. This recommendation follows the consensus statement:
Guidelines for Management of Incidental Pulmonary Nodules Detected
[DATE].
3. Findings suggestive of hepatic steatosis. Further evaluation with
LFTs could be performed as indicated.
4. Marked asymmetric enlargement of the right lobe of the thyroid,
similar to the [DATE] examination. Further evaluation with
dedicated nonemergent thyroid ultrasound be performed as clinically
indicated.
5. Coronary calcifications.  Aortic Atherosclerosis (UQS7Q-773.3).

## 2020-02-11 ENCOUNTER — Other Ambulatory Visit: Payer: Self-pay

## 2020-02-11 MED ORDER — CEPHALEXIN 500 MG PO CAPS: 500.0000 mg | ORAL_CAPSULE | Freq: Two times a day (BID) | ORAL | 0 refills | Status: AC

## 2020-02-11 NOTE — Progress Notes (Signed)
Patient called today to discuss surgery again and Cephalexin was sent in based of of April's phone call.

## 2020-02-17 ENCOUNTER — Ambulatory Visit: Payer: Medicare HMO | Admitting: Dermatology

## 2020-02-17 ENCOUNTER — Other Ambulatory Visit: Payer: Self-pay

## 2020-02-17 DIAGNOSIS — D239 Other benign neoplasm of skin, unspecified: Secondary | ICD-10-CM | POA: Diagnosis not present

## 2020-02-17 DIAGNOSIS — D2361 Other benign neoplasm of skin of right upper limb, including shoulder: Secondary | ICD-10-CM

## 2020-02-17 MED ORDER — MUPIROCIN 2 % EX OINT: 1.0000 "application " | TOPICAL_OINTMENT | Freq: Every day | CUTANEOUS | 0 refills | Status: DC

## 2020-02-17 NOTE — Progress Notes (Signed)
   Follow-Up Visit   Subjective  Jackson EBER Sr. is a 69 y.o. 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  The following portions of the chart were reviewed this encounter and updated as appropriate:  Tobacco  Allergies  Meds  Problems  Med Hx  Surg Hx  Fam Hx     Review of Systems:  No other skin or systemic complaints except as noted in HPI or Assessment and Plan.  Objective  Well appearing patient in no apparent distress; mood and affect are within normal limits.  A focused examination was performed including right arm, back. Relevant physical exam findings are noted in the Assessment and Plan.  Objective  Right post shoulder/deltoid: Well healed biopsy site   Assessment & Plan  Dysplastic nevus - biopsy proven SEVERE Right post shoulder/deltoid  Continue Cephalexin as prescribed.  Skin excision - Right post shoulder/deltoid  Lesion length (cm):  3.2 Lesion width (cm):  2.5 Margin per side (cm):  0.3 Total excision diameter (cm):  3.8 Informed consent: discussed and consent obtained   Timeout: patient name, date of birth, surgical site, and procedure verified   Procedure prep:  Patient was prepped and draped in usual sterile fashion Prep type:  Isopropyl alcohol and povidone-iodine Anesthesia: the lesion was anesthetized in a standard fashion   Anesthetic:  1% lidocaine w/ epinephrine 1-100,000 buffered w/ 8.4% NaHCO3 Instrument used: #15 blade   Hemostasis achieved with: pressure   Hemostasis achieved with comment:  Electrocautery Outcome: patient tolerated procedure well with no complications   Post-procedure details: sterile dressing applied and wound care instructions given   Dressing type: bandage and pressure dressing (mupirocin)    Skin repair - Right post shoulder/deltoid Complexity:  Complex Final length (cm):  6 Reason for type of repair: reduce tension to allow  closure, reduce the risk of dehiscence, infection, and necrosis, reduce subcutaneous dead space and avoid a hematoma, allow closure of the large defect, preserve normal anatomy, preserve normal anatomical and functional relationships and enhance both functionality and cosmetic results   Undermining: area extensively undermined   Undermining comment:  Undermining defect 3.5 cm Subcutaneous layers (deep stitches):  Suture size:  2-0 Suture type: Vicryl (polyglactin 910)   Subcutaneous suture technique: inverted dermal. Fine/surface layer approximation (top stitches):  Suture size:  2-0 Suture type: nylon   Stitches: simple running   Suture removal (days):  7 Hemostasis achieved with: suture and pressure Outcome: patient tolerated procedure well with no complications   Post-procedure details: sterile dressing applied and wound care instructions given   Dressing type: bandage and pressure dressing (mupirocin)    mupirocin ointment (BACTROBAN) 2 % - Right post shoulder/deltoid  Other Related Procedures Anatomic Pathology Report  Return in about 1 week (around 02/24/2020) for suture removal.  I, Ashok Cordia, CMA, am acting as scribe for Sarina Ser, MD .  Documentation: I have reviewed the above documentation for accuracy and completeness, and I agree with the above.  Sarina Ser, MD

## 2020-02-17 NOTE — Patient Instructions (Signed)

## 2020-02-18 ENCOUNTER — Telehealth: Payer: Self-pay

## 2020-02-18 ENCOUNTER — Encounter: Payer: Self-pay | Admitting: Dermatology

## 2020-02-18 NOTE — Telephone Encounter (Signed)
Spoke to patient regarding surgery. He is doing fine/hd 

## 2020-02-24 ENCOUNTER — Ambulatory Visit (INDEPENDENT_AMBULATORY_CARE_PROVIDER_SITE_OTHER): Payer: Medicare HMO | Admitting: Dermatology

## 2020-02-24 ENCOUNTER — Other Ambulatory Visit: Payer: Self-pay

## 2020-02-24 DIAGNOSIS — Z4802 Encounter for removal of sutures: Secondary | ICD-10-CM

## 2020-02-24 DIAGNOSIS — D239 Other benign neoplasm of skin, unspecified: Secondary | ICD-10-CM

## 2020-02-24 DIAGNOSIS — D2361 Other benign neoplasm of skin of right upper limb, including shoulder: Secondary | ICD-10-CM

## 2020-02-24 NOTE — Progress Notes (Signed)
   Follow-Up Visit   Subjective  Jackson QUEVEDO Sr. is a 69 y.o. male who presents for the following: post op/suture removal (patient is here today for suture removal).  The following portions of the chart were reviewed this encounter and updated as appropriate:  Tobacco  Allergies  Meds  Problems  Med Hx  Surg Hx  Fam Hx     Review of Systems:  No other skin or systemic complaints except as noted in HPI or Assessment and Plan.  Objective  Well appearing patient in no apparent distress; mood and affect are within normal limits.  A focused examination was performed including the trunk . Relevant physical exam findings are noted in the Assessment and Plan.  Objective  R shoulder/deltoid: Healing excision site  Assessment & Plan  Dysplastic nevus, severe - post op - margins clear R shoulder/deltoid  Encounter for Removal of Sutures - Incision site at the R post shoulder/deltoid is clean, dry and intact - Wound cleansed, sutures removed, wound cleansed and steri strips applied.  - Will contact patient with pathology results. - Patient advised to keep steri-strips dry until they fall off. - Scars remodel for a full year. - Once steri-strips fall off, patient can apply over-the-counter silicone scar cream each night to help with scar remodeling if desired. - Patient advised to call with any concerns or if they notice any new or changing lesions.   Other Related Medications mupirocin ointment (BACTROBAN) 2 %  Return in about 6 months (around 08/26/2020) for TBSE - Hx MM, severe dysplastic nevus, AK's .  Luther Redo, CMA, am acting as scribe for Sarina Ser, MD .  Documentation: I have reviewed the above documentation for accuracy and completeness, and I agree with the above.  Sarina Ser, MD

## 2020-02-25 LAB — ANATOMIC PATHOLOGY REPORT

## 2020-02-26 ENCOUNTER — Telehealth: Payer: Self-pay

## 2020-02-26 ENCOUNTER — Encounter: Payer: Self-pay | Admitting: Dermatology

## 2020-02-26 NOTE — Telephone Encounter (Signed)
Patient informed of pathology results 

## 2020-02-26 NOTE — Telephone Encounter (Signed)
-----   Message from Ralene Bathe, MD sent at 02/25/2020 11:42 AM EDT ----- Site of Severe dysplastic Margins free

## 2020-04-26 ENCOUNTER — Other Ambulatory Visit: Payer: Self-pay | Admitting: Internal Medicine

## 2020-04-26 DIAGNOSIS — R1310 Dysphagia, unspecified: Secondary | ICD-10-CM

## 2020-04-29 ENCOUNTER — Other Ambulatory Visit: Payer: Self-pay

## 2020-04-29 ENCOUNTER — Ambulatory Visit
Admission: RE | Admit: 2020-04-29 | Discharge: 2020-04-29 | Disposition: A | Discharge status: Home / Self Care | Payer: Medicare HMO | Source: Ambulatory Visit | Attending: Internal Medicine | Admitting: Internal Medicine

## 2020-04-29 DIAGNOSIS — R1310 Dysphagia, unspecified: Secondary | ICD-10-CM | POA: Diagnosis present

## 2020-08-26 ENCOUNTER — Ambulatory Visit: Payer: Medicare HMO | Admitting: Dermatology

## 2020-11-15 ENCOUNTER — Ambulatory Visit: Payer: Medicare HMO | Admitting: Dermatology

## 2020-11-15 ENCOUNTER — Other Ambulatory Visit: Payer: Self-pay

## 2020-11-15 DIAGNOSIS — Z1283 Encounter for screening for malignant neoplasm of skin: Secondary | ICD-10-CM | POA: Diagnosis not present

## 2020-11-15 DIAGNOSIS — Z85828 Personal history of other malignant neoplasm of skin: Secondary | ICD-10-CM

## 2020-11-15 DIAGNOSIS — D229 Melanocytic nevi, unspecified: Secondary | ICD-10-CM | POA: Diagnosis not present

## 2020-11-15 DIAGNOSIS — D2372 Other benign neoplasm of skin of left lower limb, including hip: Secondary | ICD-10-CM | POA: Diagnosis not present

## 2020-11-15 DIAGNOSIS — L578 Other skin changes due to chronic exposure to nonionizing radiation: Secondary | ICD-10-CM

## 2020-11-15 DIAGNOSIS — B353 Tinea pedis: Secondary | ICD-10-CM

## 2020-11-15 DIAGNOSIS — Z86018 Personal history of other benign neoplasm: Secondary | ICD-10-CM

## 2020-11-15 DIAGNOSIS — Z85820 Personal history of malignant melanoma of skin: Secondary | ICD-10-CM

## 2020-11-15 DIAGNOSIS — D1800 Hemangioma unspecified site: Secondary | ICD-10-CM

## 2020-11-15 DIAGNOSIS — L814 Other melanin hyperpigmentation: Secondary | ICD-10-CM

## 2020-11-15 DIAGNOSIS — D489 Neoplasm of uncertain behavior, unspecified: Secondary | ICD-10-CM

## 2020-11-15 DIAGNOSIS — L570 Actinic keratosis: Secondary | ICD-10-CM

## 2020-11-15 DIAGNOSIS — L821 Other seborrheic keratosis: Secondary | ICD-10-CM

## 2020-11-15 MED ORDER — KETOCONAZOLE 2 % EX CREA
1.0000 | TOPICAL_CREAM | Freq: Every day | CUTANEOUS | 11 refills | Status: DC
Start: 2020-11-15 — End: 2020-12-27

## 2020-11-15 NOTE — Progress Notes (Signed)
Follow-Up Visit   Subjective  Jackson ASHE Sr. is a 70 y.o. male who presents for the following: 6 month follow up (Patient here today for 6 month follow up. He states that he has no new concerns today. Patient states he has a history of melanoma, bcc, and scc. ). The patient presents for Total-Body Skin Exam (TBSE) for skin cancer screening and mole check.  The following portions of the chart were reviewed this encounter and updated as appropriate:  Tobacco  Allergies  Meds  Problems  Med Hx  Surg Hx  Fam Hx      Objective  Well appearing patient in no apparent distress; mood and affect are within normal limits.  A full examination was performed including scalp, head, eyes, ears, nose, lips, neck, chest, axillae, abdomen, back, buttocks, bilateral upper extremities, bilateral lower extremities, hands, feet, fingers, toes, fingernails, and toenails. All findings within normal limits unless otherwise noted below.  Objective  bilateral feet: Scaling and maceration web spaces and over distal and lateral soles.   Objective  face/scalp x 8 (8): Erythematous thin papules/macules with gritty scale.   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  ketoconazole (NIZORAL) 2 % cream - bilateral feet  Actinic keratosis (8) face/scalp x 8 Prior to procedure, discussed risks of blister formation, small wound, skin dyspigmentation, or rare scar following cryotherapy.   Destruction of lesion - face/scalp x 8 Complexity: simple   Destruction method: cryotherapy   Informed consent: discussed and consent obtained   Timeout:  patient name, date of birth, surgical site, and procedure verified Lesion destroyed using liquid nitrogen: Yes   Region frozen until ice ball extended beyond lesion: Yes    Outcome: patient tolerated procedure well with no complications   Post-procedure details: wound care instructions given    Neoplasm of uncertain behavior left proximal pretibia Skin / nail biopsy Type of biopsy: tangential   Informed consent: discussed and consent obtained   Timeout: patient name, date of birth, surgical site, and procedure verified   Procedure prep:  Patient was prepped and draped in usual sterile fashion Prep type:  Isopropyl alcohol Anesthesia: the lesion was anesthetized in a standard fashion   Anesthetic:  1% lidocaine w/ epinephrine 1-100,000 buffered w/ 8.4% NaHCO3 Instrument used: flexible razor blade   Hemostasis achieved with: pressure, aluminum chloride and electrodesiccation   Outcome: patient tolerated procedure well   Post-procedure details: sterile dressing applied and wound care instructions given   Dressing type: petrolatum and bandage    Specimen 1 - Surgical pathology Differential Diagnosis: R/o inflamed seborrheic keratosis vs dysplastic nevus Check Margins: No 1.1 cm brown macule  R/o inflamed seborrheic keratosis vs dysplastic nevus  Lentigines - Scattered tan macules - Due to sun exposure - Benign-appering, observe - Recommend daily broad spectrum sunscreen SPF 30+ to sun-exposed areas, reapply every 2 hours as needed. - Call for any changes  Seborrheic Keratoses - Stuck-on, waxy, tan-brown papules and/or plaques  - Benign-appearing - Discussed benign etiology and prognosis. - Observe - Call for any changes  Melanocytic Nevi - Tan-brown and/or pink-flesh-colored symmetric macules and papules - Benign appearing on exam today - Observation - Call clinic for new or changing moles - Recommend daily use of broad spectrum spf 30+ sunscreen to  sun-exposed areas.   Hemangiomas - Red papules - Discussed benign nature - Observe - Call for any changes  Actinic Damage - Chronic condition, secondary to cumulative UV/sun exposure -  diffuse scaly erythematous macules with underlying dyspigmentation - Recommend daily broad spectrum sunscreen SPF 30+ to sun-exposed areas, reapply every 2 hours as needed.  - Staying in the shade or wearing long sleeves, sun glasses (UVA+UVB protection) and wide brim hats (4-inch brim around the entire circumference of the hat) are also recommended for sun protection.  - Call for new or changing lesions.  History of Dysplastic Nevi - No evidence of recurrence today - Recommend regular full body skin exams - Recommend daily broad spectrum sunscreen SPF 30+ to sun-exposed areas, reapply every 2 hours as needed.  - Call if any new or changing lesions are noted between office visits  History of Melanoma - No evidence of recurrence today - No lymphadenopathy - Recommend regular full body skin exams - Recommend daily broad spectrum sunscreen SPF 30+ to sun-exposed areas, reapply every 2 hours as needed.  - Call if any new or changing lesions are noted between office visits  History of Basal Cell Carcinoma of the Skin - No evidence of recurrence today - Recommend regular full body skin exams - Recommend daily broad spectrum sunscreen SPF 30+ to sun-exposed areas, reapply every 2 hours as needed.  - Call if any new or changing lesions are noted between office visits  Skin cancer screening performed today.  Return in about 6 months (around 05/18/2021) for tbse.  IRuthell Jordan, CMA, am acting as scribe for Sarina Ser, MD.  Documentation: I have reviewed the above documentation for accuracy and completeness, and I agree with the above.  Sarina Ser, MD

## 2020-11-15 NOTE — Patient Instructions (Addendum)
Biopsy Wound Care Instructions  1. Leave the original bandage on for 24 hours if possible.  If the bandage becomes soaked or soiled before that time, it is OK to remove it and examine the wound.  A small amount of post-operative bleeding is normal.  If excessive bleeding occurs, remove the bandage, place gauze over the site and apply continuous pressure (no peeking) over the area for 30 minutes. If this does not work, please call our clinic as soon as possible or page your doctor if it is after hours.   2. Once a day, cleanse the wound with soap and water. It is fine to shower. If a thick crust develops you may use a Q-tip dipped into dilute hydrogen peroxide (mix 1:1 with water) to dissolve it.  Hydrogen peroxide can slow the healing process, so use it only as needed.    3. After washing, apply petroleum jelly (Vaseline) or an antibiotic ointment if your doctor prescribed one for you, followed by a bandage.    4. For best healing, the wound should be covered with a layer of ointment at all times. If you are not able to keep the area covered with a bandage to hold the ointment in place, this may mean re-applying the ointment several times a day.  Continue this wound care until the wound has healed and is no longer open.   Itching and mild discomfort is normal during the healing process. However, if you develop pain or severe itching, please call our office.   If you have any discomfort, you can take Tylenol (acetaminophen) or ibuprofen as directed on the bottle. (Please do not take these if you have an allergy to them or cannot take them for another reason).  Some redness, tenderness and white or yellow material in the wound is normal healing.  If the area becomes very sore and red, or develops a thick yellow-green material (pus), it may be infected; please notify us.    If you have stitches, return to clinic as directed to have the stitches removed. You will continue wound care for 2-3 days after  the stitches are removed.   Wound healing continues for up to one year following surgery. It is not unusual to experience pain in the scar from time to time during the interval.  If the pain becomes severe or the scar thickens, you should notify the office.    A slight amount of redness in a scar is expected for the first six months.  After six months, the redness will fade and the scar will soften and fade.  The color difference becomes less noticeable with time.  If there are any problems, return for a post-op surgery check at your earliest convenience.  To improve the appearance of the scar, you can use silicone scar gel, cream, or sheets (such as Mederma or Serica) every night for up to one year. These are available over the counter (without a prescription).  Please call our office at [phone] for any questions or concerns.   Fungal Infections of the Skin  Fungal infections can be spread from person to person through close personal contact.  The infection can be also be transmitted by sharing a towel, comb, or walking barefoot on a comtaminated surface.  Some fungal infections can be acquired from animals or soil. Fungus thrives in warm, moist environments, therefore patients are most at risk for developing infections when their skin stays wet for long periods.  Pool decks, underwear, and shower  tiles are common places fungus may grow.  Athlete's Foot (Tinea Pedis) Most people are infected by walking barefoot in a public place such as a locker room.  On the toes, the skin usually peels, itches, and flakes, but sometimes may blister. A dermatologist can help determine whether or not a patient has fungus.  It may look like other skin conditions such as dermatitis or psoriasis.  An antifungal cream often works well to relieve the burning and itching, and to clear the skin in mild cases of fungus.  In more severe cases, an oral anti-fungal pill may be necessary.  You can take some precautions  to prevent athlete's foot: 1. Wear sandals or flip-flops during the summer.  If you have to wear boots or shoes, sprinkle anti-fungal powder in them daily. 2. Do not walk barefoot in public places. 3. Avoid wearing other people's shoes. 4. Wash your feet daily. 5. Wear socks that wick away moisture.  Change them every day, and change your socks if they are damp.    If you have any questions or concerns for your doctor, please call our main line at [phone] and press option 4 to reach your doctor's medical assistant. If no one answers, please leave a voicemail as directed and we will return your call as soon as possible. Messages left after 4 pm will be answered the following business day.   You may also send [place] a message via [place]. We typically respond to MyChart messages within 1-2 business days.  For prescription refills, please ask your pharmacy to contact our office. Our fax number is [phone].  If you have an urgent issue when the clinic is closed that cannot wait until the next business day, you can page your doctor at the number below.    Please note that while we do our best to be available for urgent issues outside of office hours, we are not available 24/7.   If you have an urgent issue and are unable to reach [place], you may choose to seek medical care at your doctor's office, retail clinic, urgent care center, or emergency room.  If you have a medical emergency, please immediately call 911 or go to the emergency department.  Pager Numbers  - Dr. Nehemiah Massed: [phone]  - Dr. Laurence Ferrari: [phone]  - Dr. Nicole Kindred: [phone]  In the event of inclement weather, please call our main line at [phone] for an update on the status of any delays or closures.  Dermatology Medication Tips: Please keep the boxes that topical medications come in in order to help keep track of the instructions about where and how to use these. Pharmacies typically print the medication  instructions only on the boxes and not directly on the medication tubes.   If your medication is too expensive, please contact our office at [phone] option 4 or send [place] a message through [place].   We are unable to tell what your co-pay for medications will be in advance as this is different depending on your insurance coverage. However, we may be able to find a substitute medication at lower cost or fill out paperwork to get insurance to cover a needed medication.   If a prior authorization is required to get your medication covered by your insurance company, please allow [place] 1-2 business days to complete this process.  Drug prices often vary depending on where the prescription is filled and some pharmacies may offer cheaper prices.  The website www.goodrx.com contains coupons for medications through different  pharmacies. The prices here do not account for what the cost may be with help from insurance (it may be cheaper with your insurance), but the website can give you the price if you did not use any insurance.  - You can print the associated coupon and take it with your prescription to the pharmacy.  - You may also stop by our office during regular business hours and pick up a GoodRx coupon card.  - If you need your prescription sent electronically to a different pharmacy, notify our office through [facility] or by phone at [phone] option 4.

## 2020-11-17 ENCOUNTER — Encounter: Payer: Self-pay | Admitting: Dermatology

## 2020-11-18 ENCOUNTER — Telehealth: Payer: Self-pay

## 2020-11-18 NOTE — Telephone Encounter (Signed)
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

## 2020-11-18 NOTE — Telephone Encounter (Signed)
Patient informed of pathology results and appointment scheduled.  °

## 2020-12-27 ENCOUNTER — Other Ambulatory Visit: Payer: Self-pay

## 2020-12-27 ENCOUNTER — Ambulatory Visit: Payer: Medicare HMO | Admitting: Dermatology

## 2020-12-27 DIAGNOSIS — L820 Inflamed seborrheic keratosis: Secondary | ICD-10-CM

## 2020-12-27 DIAGNOSIS — L570 Actinic keratosis: Secondary | ICD-10-CM | POA: Diagnosis not present

## 2020-12-27 DIAGNOSIS — B353 Tinea pedis: Secondary | ICD-10-CM | POA: Diagnosis not present

## 2020-12-27 DIAGNOSIS — L578 Other skin changes due to chronic exposure to nonionizing radiation: Secondary | ICD-10-CM | POA: Diagnosis not present

## 2020-12-27 MED ORDER — KETOCONAZOLE 2 % EX CREA: 1.0000 "application " | TOPICAL_CREAM | Freq: Every day | CUTANEOUS | 11 refills | Status: AC

## 2020-12-27 NOTE — Patient Instructions (Signed)

## 2020-12-27 NOTE — Progress Notes (Signed)
Follow-Up Visit   Subjective  Jackson LONGMORE Sr. is a 70 y.o. male who presents for the following: Actinic Keratosis (Bx proven of the L prox pretibial - patient is here today for LN2).  The following portions of the chart were reviewed this encounter and updated as appropriate:   Tobacco  Allergies  Meds  Problems  Med Hx  Surg Hx  Fam Hx      Review of Systems:  No other skin or systemic complaints except as noted in HPI or Assessment and Plan.  Objective  Well appearing patient in no apparent distress; mood and affect are within normal limits.  A focused examination was performed including the lower legs, face, and scalp. Relevant physical exam findings are noted in the Assessment and Plan.  L prox pretibial x 1 Erythematous thin papules/macules with gritty scale.   Scalp x 16 (16) Erythematous thin papules/macules with gritty scale.   L lat nasal bridge x 1 Erythematous keratotic or waxy stuck-on papule or plaque.    Assessment & Plan  AK (actinic keratosis) L prox pretibial x 1  Bx proven -   Destruction of lesion - L prox pretibial x 1 Complexity: simple   Destruction method: cryotherapy   Informed consent: discussed and consent obtained   Timeout:  patient name, date of birth, surgical site, and procedure verified Lesion destroyed using liquid nitrogen: Yes   Region frozen until ice ball extended beyond lesion: Yes   Outcome: patient tolerated procedure well with no complications   Post-procedure details: wound care instructions given    Actinic keratosis (16) Scalp x 16  Destruction of lesion - Scalp x 16 Complexity: simple   Destruction method: cryotherapy   Informed consent: discussed and consent obtained   Timeout:  patient name, date of birth, surgical site, and procedure verified Lesion destroyed using liquid nitrogen: Yes   Region frozen until ice ball extended beyond lesion: Yes   Outcome: patient tolerated procedure well with no  complications   Post-procedure details: wound care instructions given    Inflamed seborrheic keratosis L lat nasal bridge x 1  Destruction of lesion - L lat nasal bridge x 1 Complexity: simple   Destruction method: cryotherapy   Informed consent: discussed and consent obtained   Timeout:  patient name, date of birth, surgical site, and procedure verified Lesion destroyed using liquid nitrogen: Yes   Region frozen until ice ball extended beyond lesion: Yes   Outcome: patient tolerated procedure well with no complications   Post-procedure details: wound care instructions given    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   Related Medications ketoconazole (NIZORAL) 2 % cream Apply 1 application topically at bedtime. Apply to entire foot and in between toes  Actinic Damage - Severe, confluent actinic changes with pre-cancerous actinic keratoses  - Severe, chronic, not at goal, secondary to cumulative UV radiation exposure over time - diffuse scaly erythematous macules and papules with underlying dyspigmentation - Discussed Prescription "Field Treatment" for Severe, Chronic Confluent Actinic Changes with Pre-Cancerous Actinic Keratoses Field treatment involves treatment of an entire area of skin that has confluent Actinic Changes (Sun/ Ultraviolet light damage) and PreCancerous Actinic Keratoses by method of PhotoDynamic Therapy (PDT) and/or prescription Topical Chemotherapy agents such as 5-fluorouracil, 5-fluorouracil/calcipotriene, and/or imiquimod.  The purpose is to decrease the number of clinically evident and subclinical PreCancerous lesions to prevent progression to development of skin cancer by chemically destroying early precancer changes that  may or may not be visible.  It has been shown to reduce the risk of developing skin cancer in the treated area. As a result of treatment, redness, scaling, crusting, and open sores may occur  during treatment course. One or more than one of these methods may be used and may have to be used several times to control, suppress and eliminate the PreCancerous changes. Discussed treatment course, expected reaction, and possible side effects. - Recommend daily broad spectrum sunscreen SPF 30+ to sun-exposed areas, reapply every 2 hours as needed.  - Staying in the shade or wearing long sleeves, sun glasses (UVA+UVB protection) and wide brim hats (4-inch brim around the entire circumference of the hat) are also recommended. - Call for new or changing lesions. - In one month start PDT of the scalp.   Seborrheic Keratoses - Stuck-on, waxy, tan-brown papules and/or plaques  - Benign-appearing - Discussed benign etiology and prognosis. - Observe - Call for any changes  Return for appointment as scheduled for AK follow up; return in one month for PDT of the scalp.   Luther Redo, CMA, am acting as scribe for Sarina Ser, MD .  Documentation: I have reviewed the above documentation for accuracy and completeness, and I agree with the above.  Sarina Ser, MD

## 2021-01-04 ENCOUNTER — Encounter: Payer: Self-pay | Admitting: Dermatology

## 2021-05-09 ENCOUNTER — Other Ambulatory Visit: Payer: Self-pay | Admitting: Internal Medicine

## 2021-05-09 DIAGNOSIS — R918 Other nonspecific abnormal finding of lung field: Secondary | ICD-10-CM

## 2021-05-23 ENCOUNTER — Ambulatory Visit: Payer: Medicare HMO | Admitting: Dermatology

## 2021-06-01 ENCOUNTER — Ambulatory Visit
Admission: RE | Admit: 2021-06-01 | Discharge: 2021-06-01 | Disposition: A | Discharge status: Home / Self Care | Payer: Medicare HMO | Source: Ambulatory Visit | Attending: Internal Medicine | Admitting: Internal Medicine

## 2021-06-01 ENCOUNTER — Other Ambulatory Visit: Payer: Self-pay

## 2021-06-01 DIAGNOSIS — R918 Other nonspecific abnormal finding of lung field: Secondary | ICD-10-CM | POA: Insufficient documentation

## 2021-11-14 ENCOUNTER — Encounter: Payer: Self-pay | Admitting: *Deleted

## 2021-11-14 ENCOUNTER — Encounter: Payer: Medicare HMO | Attending: Internal Medicine | Admitting: *Deleted

## 2021-11-14 VITALS — BP 140/80 | Ht 70.0 in | Wt 265.4 lb

## 2021-11-14 DIAGNOSIS — Z6838 Body mass index (BMI) 38.0-38.9, adult: Secondary | ICD-10-CM | POA: Insufficient documentation

## 2021-11-14 DIAGNOSIS — E118 Type 2 diabetes mellitus with unspecified complications: Secondary | ICD-10-CM | POA: Insufficient documentation

## 2021-11-14 DIAGNOSIS — E782 Mixed hyperlipidemia: Secondary | ICD-10-CM | POA: Diagnosis not present

## 2021-11-14 DIAGNOSIS — E6609 Other obesity due to excess calories: Secondary | ICD-10-CM | POA: Insufficient documentation

## 2021-11-14 DIAGNOSIS — Z713 Dietary counseling and surveillance: Secondary | ICD-10-CM | POA: Insufficient documentation

## 2021-11-14 DIAGNOSIS — E1159 Type 2 diabetes mellitus with other circulatory complications: Secondary | ICD-10-CM

## 2021-11-14 NOTE — Progress Notes (Signed)
Diabetes Self-Management Education ? ?Visit Type: First/Initial ? ?Appt. Start Time: 1505 Appt. End Time: 1620 ? ?11/14/2021 ? ?[name], identified by name and date of birth, is a 71 y.o. male with a diagnosis of Diabetes: Type 2.  ? ?ASSESSMENT ? ?Blood pressure 140/80, height '5\' 10"'$  (1.778 m), weight 265 lb 6.4 oz (120.4 kg). ?Body mass index is 38.08 kg/m?. ? ? Diabetes Self-Management Education - 11/14/21 1640   ? ?  ? Visit Information  ? Visit Type First/Initial   ?  ? Initial Visit  ? Diabetes Type Type 2   ? Date Diagnosed 1 year ago   ? Are you currently following a meal plan? Yes   ? What type of meal plan do you follow? "quitting sugar, cakes, cookies"   ? Are you taking your medications as prescribed? Yes   ?  ? Health Coping  ? How would you rate your overall health? Good   ?  ? Psychosocial Assessment  ? Patient Belief/Attitude about Diabetes Other (comment)   "ok"  ? What is the hardest part about your diabetes right now, causing you the most concern, or is the most worrisome to you about your diabetes?   Making healty food and beverage choices   ? Self-care barriers None   ? Self-management support Doctor's office;Family   ? Patient Concerns Nutrition/Meal planning;Glycemic Control;Medication;Weight Control;Healthy Lifestyle   ? Special Needs None   ? Preferred Learning Style Hands on;Other (comment)   talking/discussion  ? Learning Readiness Change in progress   ? How often do you need to have someone help you when you read instructions, pamphlets, or other written materials from your doctor or pharmacy? 1 - Never   ? What is the last grade level you completed in school? college   ?  ? Pre-Education Assessment  ? Patient understands the diabetes disease and treatment process. Needs Instruction   ? Patient understands incorporating nutritional management into lifestyle. Needs Instruction   ? Patient understands using medications safely. Needs Instruction   ? Patient understands monitoring  blood glucose, interpreting and using results Needs Review   ? Patient understands prevention, detection, and treatment of acute complications. Needs Instruction   ? Patient understands prevention, detection, and treatment of chronic complications. Needs Instruction   ? Patient understands how to develop strategies to address psychosocial issues. Needs Instruction   ? Patient understands how to develop strategies to promote health/change behavior. Needs Instruction   ?  ? Complications  ? Last HgB A1C per patient/outside source 6.9 %   11/03/2021  ? How often do you check your blood sugar? 3-4 times / week   ? Fasting Blood glucose range (mg/dL) 130-179   He reports FBG's 140-150's mg/dL.  ? Number of hypoglycemic episodes per month 0   He reports years of hypoglycemia prior to diagnosis of diabetes. He reported few episodes since starting Glimepiride but he wasn't eating. He is taking 1/2 tablet now. No episodes in last 2 weeks.  ? Have you had a dilated eye exam in the past 12 months? Yes   ? Have you had a dental exam in the past 12 months? Yes   ? Are you checking your feet? Yes   ? How many days per week are you checking your feet? 1   ?  ? Dietary Intake  ? Breakfast sausage or bacon with eggs, grits and milk; cheese, ham, onions, green peppers in omelet with 2 pieces of keto toast; oatmeal and  sometimes nuts with fruit (blueberries, apples)   ? Snack (morning) he reports no snacks   ? Lunch reports his biggest meal - chicken, beef, pork, fish with sweet potato or baked potato, green beans, squash/onions/zucchini; peas, occasional beans, salads with lettuce, tomato, cuccumbers, carrots, radishes, chicken   ? Dinner peanut butter or cheese with crackers   ? Beverage(s) water, juice, milk, diet soda, unsweetened tea   ?  ? Activity / Exercise  ? Activity / Exercise Type Light (walking / raking leaves)   stationary bike  ? How many days per week do you exercise? 3.5   ? How many minutes per day do you exercise?  27.5   ? Total minutes per week of exercise 96.25   ?  ? Patient Education  ? Previous Diabetes Education No   ? Disease Pathophysiology Definition of diabetes, type 1 and 2, and the diagnosis of diabetes;Factors that contribute to the development of diabetes   ? Healthy Eating Role of diet in the treatment of diabetes and the relationship between the three main macronutrients and blood glucose level;Food label reading, portion sizes and measuring food.;Reviewed blood glucose goals for pre and post meals and how to evaluate the patients' food intake on their blood glucose level.;Meal timing in regards to the patients' current diabetes medication.   ? Being Active Role of exercise on diabetes management, blood pressure control and cardiac health.   ? Medications Reviewed patients medication for diabetes, action, purpose, timing of dose and side effects.   ? Monitoring Purpose and frequency of SMBG.;Taught/discussed recording of test results and interpretation of SMBG.;Identified appropriate SMBG and/or A1C goals.   ? Chronic complications Relationship between chronic complications and blood glucose control   ? Diabetes Stress and Support Identified and addressed patients feelings and concerns about diabetes   ?  ? Individualized Goals (developed by patient)  ? Reducing Risk Other (comment)   improve blood sugars, decrease medications, lose weight, lead a healthier lifestyle, become more fit  ?  ? Outcomes  ? Expected Outcomes Demonstrated interest in learning. Expect positive outcomes   ? Future DMSE PRN   ? Program Status Not Completed   ? ?  ?  ? ?Individualized Plan for Diabetes Self-Management Training:  ? ?Learning Objective:  Patient will have a greater understanding of diabetes self-management. ?Patient education plan is to attend individual and/or group sessions per assessed needs and concerns. ?  ?Plan:  ? ?Patient Instructions  ?Check blood sugars before breakfast or 2 hrs after one meal 3 x  week ? ?Exercise:  Continue using stationary bike for    25-30  minutes   3-4  days a week ? ?Eat 3 meals day,   1  snack a day ?Space meals 4-6 hours apart ?Avoid sugar sweetened drinks (juices) unless treating a low blood sugar ? ?Carry fast acting glucose (peppermints) and a snack at all times ? ?Call back if you want to schedule another appointment with the dietitian or nurse ? ?Expected Outcomes:  Demonstrated interest in learning. Expect positive outcomes ? ?Education material provided:  ?Metallurgist Guidelines ?Simple Meal Plan ?Symptoms, causes and treatments of Hypoglycemia ?Quick and Balanced Meals ? ?If problems or questions, patient to contact team via:   ?[name], RN, [place], [place] [phone] ? ?Future DSME appointment: PRN ?The patient doesn't want to return for further Diabetes education at this time. He wants to continue to make diet changes and call back if he needs further assistance.  ?

## 2021-11-14 NOTE — Patient Instructions (Signed)
Check blood sugars before breakfast or 2 hrs after one meal 3 x week ? ?Exercise:  Continue using stationary bike for    25-30  minutes   3-4  days a week ? ?Eat 3 meals day,   1  snack a day ?Space meals 4-6 hours apart ?Avoid sugar sweetened drinks (juices) unless treating a low blood sugar ? ?Carry fast acting glucose (peppermints) and a snack at all times ? ?Call back if you want to schedule another appointment with the dietitian or nurse ?

## 2022-03-02 ENCOUNTER — Encounter: Payer: Self-pay | Admitting: *Deleted

## 2022-03-02 ENCOUNTER — Encounter: Payer: Medicare HMO | Attending: Internal Medicine | Admitting: *Deleted

## 2022-03-02 VITALS — BP 124/80 | Wt 262.7 lb

## 2022-03-02 DIAGNOSIS — E782 Mixed hyperlipidemia: Secondary | ICD-10-CM | POA: Diagnosis not present

## 2022-03-02 DIAGNOSIS — Z6837 Body mass index (BMI) 37.0-37.9, adult: Secondary | ICD-10-CM | POA: Diagnosis not present

## 2022-03-02 DIAGNOSIS — Z713 Dietary counseling and surveillance: Secondary | ICD-10-CM | POA: Diagnosis not present

## 2022-03-02 DIAGNOSIS — E6609 Other obesity due to excess calories: Secondary | ICD-10-CM | POA: Insufficient documentation

## 2022-03-02 DIAGNOSIS — E118 Type 2 diabetes mellitus with unspecified complications: Secondary | ICD-10-CM | POA: Diagnosis present

## 2022-03-02 DIAGNOSIS — E1159 Type 2 diabetes mellitus with other circulatory complications: Secondary | ICD-10-CM

## 2022-03-02 NOTE — Progress Notes (Signed)
Diabetes Self-Management Education  Visit Type: Follow-up  Appt. Start Time: 1325 Appt. End Time: 4128  03/02/2022  [name], identified by name and date of birth, is a 71 y.o. male with a diagnosis of Diabetes: Type 2   ASSESSMENT  Blood pressure 124/80, weight 262 lb 11.2 oz (119.2 kg). Body mass index is 37.69 kg/m.   Diabetes Self-Management Education - 03/02/22 1617       Visit Information   Visit Type Follow-up      Complications   Last HgB A1C per patient/outside source 6.3 %   02/14/2022   How often do you check your blood sugar? 3-4 times / week    Fasting Blood glucose range (mg/dL) 70-129   he reports FBG's 120-130 mg/dL   Number of hypoglycemic episodes per month 2  He doesn't test his blood sugar. Usually occurs around 2-3 pm related to meal timing.    Can you tell when your blood sugar is low? Yes   jittery   What do you do if your blood sugar is low? eat something    Number of hyperglycemic episodes ( >'200mg'$ /dL): Never    Have you had a dilated eye exam in the past 12 months? Yes    Have you had a dental exam in the past 12 months? Yes    Are you checking your feet? Yes    How many days per week are you checking your feet? 1      Dietary Intake   Breakfast 2 meals and 2 snacks/day    Beverage(s) water, milk, diet soda, unsweetened tea      Activity / Exercise   Activity / Exercise Type ADL's      Patient Education   Disease Pathophysiology Definition of diabetes, type 1 and 2, and the diagnosis of diabetes;Explored patient's options for treatment of their diabetes    Healthy Eating Role of diet in the treatment of diabetes and the relationship between the three main macronutrients and blood glucose level;Food label reading, portion sizes and measuring food.;Carbohydrate counting;Reviewed blood glucose goals for pre and post meals and how to evaluate the patients' food intake on their blood glucose level.;Meal timing in regards to the patients'  current diabetes medication.    Being Active Role of exercise on diabetes management, blood pressure control and cardiac health.    Medications Reviewed patients medication for diabetes, action, purpose, timing of dose and side effects.    Monitoring Purpose and frequency of SMBG.;Taught/discussed recording of test results and interpretation of SMBG.;Identified appropriate SMBG and/or A1C goals.    Acute complications Taught prevention, symptoms, and  treatment of hypoglycemia - the 15 rule.    Chronic complications Relationship between chronic complications and blood glucose control    Diabetes Stress and Support Identified and addressed patients feelings and concerns about diabetes      Individualized Goals (developed by patient)   Nutrition Follow meal plan discussed;General guidelines for healthy choices and portions discussed    Physical Activity Exercise 3-5 times per week;30 minutes per day    Medications take my medication as prescribed    Monitoring  Test my blood glucose as discussed    Problem Solving Eating Pattern    Reducing Risk treat hypoglycemia with 15 grams of carbs if blood glucose less than '70mg'$ /dL      Post-Education Assessment   Patient understands the diabetes disease and treatment process. Comprehends key points    Patient understands incorporating nutritional management into lifestyle. Needs Review  Patient undertands incorporating physical activity into lifestyle. Comprehends key points    Patient understands using medications safely. Demonstrates understanding / competency    Patient understands monitoring blood glucose, interpreting and using results Comprehends key points    Patient understands prevention, detection, and treatment of acute complications. Comprehends key points    Patient understands prevention, detection, and treatment of chronic complications. Demonstrates understanding / competency    Patient understands how to develop strategies to address  psychosocial issues. Demonstrates understanding / competency    Patient understands how to develop strategies to promote health/change behavior. Demonstrates understanding / competency      Outcomes   Expected Outcomes Demonstrated interest in learning. Expect positive outcomes    Program Status Completed   Re-entered     Subsequent Visit   Since your last visit have you continued or begun to take your medications as prescribed? Yes    Since your last visit have you had your blood pressure checked? Yes    Is your most recent blood pressure lower, unchanged, or higher since your last visit? Lower    Since your last visit have you experienced any weight changes? Loss    Weight Loss (lbs) 2.7    Since your last visit, are you checking your blood glucose at least once a day? No   1 x week        Individualized Plan for Diabetes Self-Management Training:   Learning Objective:  Patient will have a greater understanding of diabetes self-management. Patient education plan is to attend individual and/or group sessions per assessed needs and concerns.   Plan:   Patient Instructions  Check blood sugars before breakfast or 2 hrs after one meal 3 times a week  Exercise:  Continue using stationary bike for    20-30  minutes   3-4   days a week  Eat 3 meals day,   1-2  snacks a day Space meals 4-6 hours apart Allow 2-3 hours between meals and snacks Don't skips meals - eat at least 1 protein and 1 carbohydrate serving Add non-starchy vegetables to meals and decrease portions of carbohydrates  Carry fast acting glucose (glucose tabs or peppermints) and a snack at all times  Expected Outcomes:  Demonstrated interest in learning. Expect positive outcomes  Education material provided:  Simple Meal Plan Glucose tablets Symptoms, causes and treatments of Hypoglycemia Planning a Balanced Meal Healthy Snack Choices (ADA) Making Choices Using Food Labels (ADA)  If problems or questions,  patient to contact team via:   [name], RN, CCM, [place] [phone]  Future DSME appointment:  PRN

## 2022-03-02 NOTE — Patient Instructions (Signed)
Check blood sugars before breakfast or 2 hrs after one meal 3 times a week  Exercise:  Continue using stationary bike for    20-30  minutes   3-4   days a week  Eat 3 meals day,   1-2  snacks a day Space meals 4-6 hours apart Allow 2-3 hours between meals and snacks Don't skips meals - eat at least 1 protein and 1 carbohydrate serving Add non-starchy vegetables to meals and decrease portions of carbohydrates  Carry fast acting glucose (glucose tabs or peppermints) and a snack at all times

## 2022-11-01 ENCOUNTER — Ambulatory Visit: Payer: Medicare HMO | Admitting: Dermatology

## 2022-11-01 VITALS — BP 161/93

## 2022-11-01 DIAGNOSIS — D229 Melanocytic nevi, unspecified: Secondary | ICD-10-CM

## 2022-11-01 DIAGNOSIS — L578 Other skin changes due to chronic exposure to nonionizing radiation: Secondary | ICD-10-CM

## 2022-11-01 DIAGNOSIS — L570 Actinic keratosis: Secondary | ICD-10-CM

## 2022-11-01 DIAGNOSIS — L821 Other seborrheic keratosis: Secondary | ICD-10-CM

## 2022-11-01 DIAGNOSIS — D485 Neoplasm of uncertain behavior of skin: Secondary | ICD-10-CM

## 2022-11-01 DIAGNOSIS — L814 Other melanin hyperpigmentation: Secondary | ICD-10-CM

## 2022-11-01 DIAGNOSIS — Z1283 Encounter for screening for malignant neoplasm of skin: Secondary | ICD-10-CM | POA: Diagnosis not present

## 2022-11-01 DIAGNOSIS — C4491 Basal cell carcinoma of skin, unspecified: Secondary | ICD-10-CM

## 2022-11-01 DIAGNOSIS — Z7189 Other specified counseling: Secondary | ICD-10-CM

## 2022-11-01 DIAGNOSIS — L820 Inflamed seborrheic keratosis: Secondary | ICD-10-CM

## 2022-11-01 DIAGNOSIS — Z79899 Other long term (current) drug therapy: Secondary | ICD-10-CM

## 2022-11-01 DIAGNOSIS — Z5111 Encounter for antineoplastic chemotherapy: Secondary | ICD-10-CM

## 2022-11-01 DIAGNOSIS — C44319 Basal cell carcinoma of skin of other parts of face: Secondary | ICD-10-CM | POA: Diagnosis not present

## 2022-11-01 DIAGNOSIS — D1801 Hemangioma of skin and subcutaneous tissue: Secondary | ICD-10-CM

## 2022-11-01 HISTORY — DX: Basal cell carcinoma of skin, unspecified: C44.91

## 2022-11-01 NOTE — Progress Notes (Signed)
Follow-Up Visit   Subjective  Jackson CHURA Sr. is a 72 y.o. male who presents for the following: Skin Cancer Screening and Full Body Skin Exam - History of AK and dysplastic nevi The patient presents for Total-Body Skin Exam (TBSE) for skin cancer screening and mole check. The patient has spots, moles and lesions to be evaluated, some may be new or changing and the patient has concerns that these could be cancer.  The following portions of the chart were reviewed this encounter and updated as appropriate: medications, allergies, medical history  Review of Systems:  No other skin or systemic complaints except as noted in HPI or Assessment and Plan.  Objective  Well appearing patient in no apparent distress; mood and affect are within normal limits.  A full examination was performed including scalp, head, eyes, ears, nose, lips, neck, chest, axillae, abdomen, back, buttocks, bilateral upper extremities, bilateral lower extremities, hands, feet, fingers, toes, fingernails, and toenails. All findings within normal limits unless otherwise noted below.   Relevant physical exam findings are noted in the Assessment and Plan.  Right Temple Indurated plaque with erythema and hypopigmentation  Left post thigh Flesh colored papule  Right cheek 0.4 cm brown papule  Scalp (12) Erythematous thin papules/macules with gritty scale.   Scalp (5) Erythematous stuck-on, waxy papule or plaque   Assessment & Plan   Purpura - Chronic; persistent and recurrent.  Treatable, but not curable. - Violaceous macules and patches - Benign - Related to trauma, age, sun damage and/or use of blood thinners, chronic use of topical and/or oral steroids - Observe - Can use OTC arnica containing moisturizer such as Dermend Bruise Formula if desired - Call for worsening or other concerns  LENTIGINES, SEBORRHEIC KERATOSES, HEMANGIOMAS - Benign normal skin lesions - Benign-appearing - Call for any  changes  MELANOCYTIC NEVI - Tan-brown and/or pink-flesh-colored symmetric macules and papules - Benign appearing on exam today - Observation - Call clinic for new or changing moles - Recommend daily use of broad spectrum spf 30+ sunscreen to sun-exposed areas.   ACTINIC DAMAGE - Chronic condition, secondary to cumulative UV/sun exposure - diffuse scaly erythematous macules with underlying dyspigmentation - Recommend daily broad spectrum sunscreen SPF 30+ to sun-exposed areas, reapply every 2 hours as needed.  - Staying in the shade or wearing long sleeves, sun glasses (UVA+UVB protection) and wide brim hats (4-inch brim around the entire circumference of the hat) are also recommended for sun protection.  - Call for new or changing lesions.  SKIN CANCER SCREENING PERFORMED TODAY.   Neoplasm of uncertain behavior of skin (3) Right Temple See photo Skin / nail biopsy Type of biopsy: tangential   Informed consent: discussed and consent obtained   Timeout: patient name, date of birth, surgical site, and procedure verified   Procedure prep:  Patient was prepped and draped in usual sterile fashion Prep type:  Isopropyl alcohol Anesthesia: the lesion was anesthetized in a standard fashion   Anesthetic:  1% lidocaine w/ epinephrine 1-100,000 buffered w/ 8.4% NaHCO3 Instrument used: flexible razor blade   Hemostasis achieved with: pressure, aluminum chloride and electrodesiccation   Outcome: patient tolerated procedure well   Post-procedure details: sterile dressing applied and wound care instructions given   Dressing type: bandage and petrolatum    Specimen 1 - Surgical pathology Differential Diagnosis: R/O Ca Check Margins: No  Right cheek Epidermal / dermal shaving  Lesion diameter (cm):  0.4 Informed consent: discussed and consent obtained   Timeout: patient  name, date of birth, surgical site, and procedure verified   Procedure prep:  Patient was prepped and draped in usual  sterile fashion Prep type:  Isopropyl alcohol Anesthesia: the lesion was anesthetized in a standard fashion   Anesthetic:  1% lidocaine w/ epinephrine 1-100,000 buffered w/ 8.4% NaHCO3 Instrument used: flexible razor blade   Hemostasis achieved with: pressure, aluminum chloride and electrodesiccation   Outcome: patient tolerated procedure well   Post-procedure details: sterile dressing applied and wound care instructions given   Dressing type: bandage and petrolatum    Specimen 2 - Surgical pathology Differential Diagnosis: Inflamed SK R/O Ca Check Margins: No  Left post thigh Will plan simple excision to left posterior thigh on follow up  AK (actinic keratosis) (12) Scalp  ACTINIC DAMAGE WITH PRECANCEROUS ACTINIC KERATOSES Counseling for Topical Chemotherapy Management: Patient exhibits: - Severe, confluent actinic changes with pre-cancerous actinic keratoses that is secondary to cumulative UV radiation exposure over time - Condition that is severe; chronic, not at goal. - diffuse scaly erythematous macules and papules with underlying dyspigmentation - Discussed Prescription "Field Treatment" topical Chemotherapy for Severe, Chronic Confluent Actinic Changes with Pre-Cancerous Actinic Keratoses Field treatment involves treatment of an entire area of skin that has confluent Actinic Changes (Sun/ Ultraviolet light damage) and PreCancerous Actinic Keratoses by method of PhotoDynamic Therapy (PDT) and/or prescription Topical Chemotherapy agents such as 5-fluorouracil, 5-fluorouracil/calcipotriene, and/or imiquimod.  The purpose is to decrease the number of clinically evident and subclinical PreCancerous lesions to prevent progression to development of skin cancer by chemically destroying early precancer changes that may or may not be visible.  It has been shown to reduce the risk of developing skin cancer in the treated area. As a result of treatment, redness, scaling, crusting, and open  sores may occur during treatment course. One or more than one of these methods may be used and may have to be used several times to control, suppress and eliminate the PreCancerous changes. Discussed treatment course, expected reaction, and possible side effects. - Recommend daily broad spectrum sunscreen SPF 30+ to sun-exposed areas, reapply every 2 hours as needed.  - Staying in the shade or wearing long sleeves, sun glasses (UVA+UVB protection) and wide brim hats (4-inch brim around the entire circumference of the hat) are also recommended. - Call for new or changing lesions.  - Start 5-fluorouracil/calcipotriene cream twice a day for 7 days to affected areas including scalp. Prescription sent to [facility]. Patient advised they will receive an email to purchase the medication online and have it sent to their home. Patient provided with handout reviewing treatment course and side effects and advised to call or message [place] on MyChart with any concerns.   Destruction of lesion - Scalp Complexity: simple   Destruction method: cryotherapy   Informed consent: discussed and consent obtained   Timeout:  patient name, date of birth, surgical site, and procedure verified Lesion destroyed using liquid nitrogen: Yes   Region frozen until ice ball extended beyond lesion: Yes   Outcome: patient tolerated procedure well with no complications   Post-procedure details: wound care instructions given    Inflamed seborrheic keratosis (5) Scalp  Destruction of lesion - Scalp Complexity: simple   Destruction method: cryotherapy   Informed consent: discussed and consent obtained   Timeout:  patient name, date of birth, surgical site, and procedure verified Lesion destroyed using liquid nitrogen: Yes   Region frozen until ice ball extended beyond lesion: Yes   Outcome: patient tolerated procedure  well with no complications   Post-procedure details: wound care instructions given     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  I, Joanie Coddington, CMA, am acting as scribe for Armida Sans, MD .  Documentation: I have reviewed the above documentation for accuracy and completeness, and I agree with the above.  Armida Sans, MD

## 2022-11-01 NOTE — Patient Instructions (Addendum)
Instructions for [facility]ations  One or more of your medications was sent to the [facility] mail order compounding pharmacy. You will receive an email from them and can purchase the medicine through that link. It will then be mailed to your home at the address you confirmed. If for any reason you do not receive an email from them, please check your spam folder. If you still do not find the email, please let us know. [facility] phone number is [phone].   - Start 5-fluorouracil/calcipotriene cream twice a day for 7 days to affected areas including scalp. Prescription sent to [facility]. Patient advised they will receive an email to purchase the medication online and have it sent to their home. Patient provided with handout reviewing treatment course and side effects and advised to call or message [place] on MyChart with any concerns.   Cryotherapy Aftercare  Wash gently with soap and water everyday.   Apply Vaseline and Band-Aid daily until healed.    Shave Excision Benign Lesion Wound Care Instructions  Leave the original bandage on for 24 hours if possible.  If the bandage becomes soaked or soiled before that time, it is OK to remove it and examine the wound.  A small amount of post-operative bleeding is normal.  If excessive bleeding occurs, remove the bandage, place gauze over the site and apply continuous pressure (no peeking) over the area for 20-30 minutes.  If this does not stop the bleeding, try again for 40 minutes.  If this does not work, please call our clinic as soon as possible (even if after-hours).    Twice a day, cleanse the wound with soap and water.  If a thick crust develops you may use a Q-tip dipped into dilute hydrogen peroxide (mix 1:1 with water) to dissolve it.  Hydrogen peroxide can slow the healing process, so use it only as needed.  After washing, apply Vaseline jelly or Polysporin ointment.  For best healing, the wound  should be covered with a layer of ointment at all times.  This may mean re-applying the ointment several times a day.  For open wounds, continue until it has healed.    If you have any swelling, keep the area elevated.  Some redness, tenderness and white or yellow material in the wound is normal healing.  If the area becomes very sore and red, or develops a thick yellow-green material (pus), it may be infected; please notify us.    Wound healing continues for up to one year following surgery.  It is not unusual to experience pain in the scar from time to time during the interval.  If the pain becomes severe or the scar thickens, you should notify the office.  A slight amount of redness in a scar is expected for the first six months.  After six months, the redness subsides and the scar will soften and fade.  The color difference becomes less noticeable with time.  If there are any problems, return for a post-op surgery check at your earliest convenience.  Please call our office for any questions or concerns.    Due to recent changes in healthcare laws, you may see results of your pathology and/or laboratory studies on MyChart before the doctors have had a chance to review them. We understand that in some cases there may be results that are confusing or concerning to you. Please understand that not all results are received at the same time and often the doctors  may need to interpret multiple results in order to provide you with the best plan of care or course of treatment. Therefore, we ask that you please give [place] 2 business days to thoroughly review all your results before contacting the office for clarification. Should we see a critical lab result, you will be contacted sooner.   If You Need Anything After Your Visit  If you have any questions or concerns for your doctor, please call our main line at [phone] and press option 4 to reach your doctor's medical assistant. If no one answers, please  leave a voicemail as directed and we will return your call as soon as possible. Messages left after 4 pm will be answered the following business day.   You may also send [place] a message via MyChart. We typically respond to MyChart messages within 1-2 business days.  For prescription refills, please ask your pharmacy to contact our office. Our fax number is [phone].  If you have an urgent issue when the clinic is closed that cannot wait until the next business day, you can page your doctor at the number below.    Please note that while we do our best to be available for urgent issues outside of office hours, we are not available 24/7.   If you have an urgent issue and are unable to reach [place], you may choose to seek medical care at your doctor's office, retail clinic, urgent care center, or emergency room.  If you have a medical emergency, please immediately call 911 or go to the emergency department.  Pager Numbers  - Dr. Gwen Pounds: [phone]  - Dr. Neale Burly: [phone]  - Dr. Roseanne Reno: [phone]  In the event of inclement weather, please call our main line at [phone] for an update on the status of any delays or closures.  Dermatology Medication Tips: Please keep the boxes that topical medications come in in order to help keep track of the instructions about where and how to use these. Pharmacies typically print the medication instructions only on the boxes and not directly on the medication tubes.   If your medication is too expensive, please contact our office at [phone] option 4 or send [place] a message through MyChart.   We are unable to tell what your co-pay for medications will be in advance as this is different depending on your insurance coverage. However, we may be able to find a substitute medication at lower cost or fill out paperwork to get insurance to cover a needed medication.   If a prior authorization is required to get your medication covered by your insurance  company, please allow [place] 1-2 business days to complete this process.  Drug prices often vary depending on where the prescription is filled and some pharmacies may offer cheaper prices.  The website www.goodrx.com contains coupons for medications through different pharmacies. The prices here do not account for what the cost may be with help from insurance (it may be cheaper with your insurance), but the website can give you the price if you did not use any insurance.  - You can print the associated coupon and take it with your prescription to the pharmacy.  - You may also stop by our office during regular business hours and pick up a GoodRx coupon card.  - If you need your prescription sent electronically to a different pharmacy, notify our office through [facility] or by phone at [phone] option 4.     Si Usted Necesita Algo Despus  de Su Visita  Tambin puede enviarnos un mensaje a travs de Clinical cytogeneticist. Por lo general respondemos a los mensajes de MyChart en el transcurso de 1 a 2 das hbiles.  Para renovar recetas, por favor pida a su farmacia que se ponga en contacto con nuestra oficina. Annie Sable de fax es [place] [phone].  Si tiene un asunto urgente cuando la clnica est cerrada y que no puede esperar hasta el siguiente da hbil, puede llamar/localizar a su doctor(a) al nmero que aparece a continuacin.   Por favor, tenga en cuenta que aunque hacemos todo lo posible para estar disponibles para asuntos urgentes fuera del horario de [place], no estamos disponibles las 24 horas del da, los 7 [address]  [address] de la [place].   Si tiene un problema urgente y no puede comunicarse con nosotros, puede optar por buscar atencin mdica  en el consultorio de su doctor(a), en una clnica privada, en un centro de atencin urgente o en una sala de emergencias.  Si tiene Engineer, drilling, por favor llame inmediatamente al 911 o vaya a la sala de emergencias.  Nmeros de bper  - Dr.  Gwen Pounds: [phone]  - Dra. Moye: [phone]  - Dra. Roseanne Reno: [phone]  En caso de inclemencias del [place], por favor llame a Lacy Duverney principal al [phone] para una actualizacin sobre el [place] de cualquier retraso o cierre.  Consejos para la medicacin en dermatologa: Por favor, guarde las cajas en las que vienen los medicamentos de uso tpico para ayudarle a seguir las instrucciones sobre dnde y cmo usarlos. Las farmacias generalmente imprimen las instrucciones del medicamento slo en las cajas y no directamente en los tubos del [place].   Si su medicamento es muy caro, por favor, pngase en contacto con Rolm Gala llamando al [phone] y presione la opcin 4 o envenos un mensaje a travs de Clinical cytogeneticist.   No podemos decirle cul ser su copago por los medicamentos por adelantado ya que esto es diferente dependiendo de la cobertura de su seguro. Sin embargo, es posible que podamos encontrar un medicamento sustituto a Audiological scientist un formulario para que el seguro cubra el medicamento que se considera necesario.   Si se requiere una autorizacin previa para que su compaa de seguros [place] su medicamento, por favor permtanos de 1 a 2 das hbiles para completar [address]  Los precios de los medicamentos varan con frecuencia dependiendo del Environmental consultant de dnde se surte la receta y alguna farmacias pueden ofrecer precios ms baratos.  El sitio web www.goodrx.com tiene cupones para medicamentos de Health and safety inspector. Los precios aqu no tienen en cuenta lo que podra costar con la ayuda del seguro (puede ser ms barato con su seguro), pero el sitio web puede darle el precio si no utiliz Tourist information centre manager.  - Puede imprimir el cupn correspondiente y llevarlo con su receta a la farmacia.  - Tambin puede pasar por nuestra oficina durante el horario de atencin regular y Education officer, museum una tarjeta de cupones de GoodRx.  - Si necesita que su receta se enve  electrnicamente a una farmacia diferente, informe a nuestra oficina a travs de MyChart de [place] o por telfono llamando al [phone] y presione la opcin 4.

## 2022-11-07 ENCOUNTER — Telehealth: Payer: Self-pay

## 2022-11-07 DIAGNOSIS — C44310 Basal cell carcinoma of skin of unspecified parts of face: Secondary | ICD-10-CM

## 2022-11-07 MED ORDER — CALCIPOTRIENE 0.005 % EX CREA: TOPICAL_CREAM | Freq: Two times a day (BID) | CUTANEOUS | 0 refills | Status: AC

## 2022-11-07 MED ORDER — FLUOROURACIL 5 % EX CREA: TOPICAL_CREAM | Freq: Two times a day (BID) | CUTANEOUS | 0 refills | Status: AC

## 2022-11-07 NOTE — Telephone Encounter (Signed)
-----   Message from Deirdre Evener, MD sent at 11/07/2022 11:31 AM EDT ----- Diagnosis 1. Skin , right temple BASAL CELL CARCINOMA, NODULAR AND INFILTRATIVE PATTERNS 2. Skin , right cheek SEBORRHEIC KERATOSIS, INFLAMED  1- Cancer = BCC Schedule for MOHS 2- benign inflamed keratosis No further treatment needed

## 2022-11-07 NOTE — Telephone Encounter (Signed)
Left pt message to call for bx results/sh °

## 2022-11-07 NOTE — Telephone Encounter (Signed)
-----   Message from David C Kowalski, MD sent at 11/07/2022 11:31 AM EDT ----- Diagnosis 1. Skin , right temple BASAL CELL CARCINOMA, NODULAR AND INFILTRATIVE PATTERNS 2. Skin , right cheek SEBORRHEIC KERATOSIS, INFLAMED  1- Cancer = BCC Schedule for MOHS 2- benign inflamed keratosis No further treatment needed 

## 2022-11-07 NOTE — Telephone Encounter (Signed)
Patient advised of BX results and would like referral to [facility] for Dr. Lorn Junes.   Patient also unable to afford $45 copay at [facility] for the Fluorouracil/Calcipotriene Mix. Sent separately to [facility] to see if patient can get prescriptions covered through insurance. aw

## 2022-11-08 ENCOUNTER — Encounter: Payer: Self-pay | Admitting: Dermatology

## 2022-11-28 ENCOUNTER — Ambulatory Visit (INDEPENDENT_AMBULATORY_CARE_PROVIDER_SITE_OTHER): Payer: Medicare HMO | Admitting: Dermatology

## 2022-11-28 ENCOUNTER — Other Ambulatory Visit: Payer: Self-pay | Admitting: Internal Medicine

## 2022-11-28 VITALS — BP 167/99 | HR 83

## 2022-11-28 DIAGNOSIS — D1724 Benign lipomatous neoplasm of skin and subcutaneous tissue of left leg: Secondary | ICD-10-CM

## 2022-11-28 DIAGNOSIS — E041 Nontoxic single thyroid nodule: Secondary | ICD-10-CM

## 2022-11-28 DIAGNOSIS — Z79899 Other long term (current) drug therapy: Secondary | ICD-10-CM

## 2022-11-28 DIAGNOSIS — L578 Other skin changes due to chronic exposure to nonionizing radiation: Secondary | ICD-10-CM

## 2022-11-28 DIAGNOSIS — C44319 Basal cell carcinoma of skin of other parts of face: Secondary | ICD-10-CM | POA: Diagnosis not present

## 2022-11-28 DIAGNOSIS — L820 Inflamed seborrheic keratosis: Secondary | ICD-10-CM | POA: Diagnosis not present

## 2022-11-28 DIAGNOSIS — W908XXA Exposure to other nonionizing radiation, initial encounter: Secondary | ICD-10-CM

## 2022-11-28 DIAGNOSIS — X32XXXA Exposure to sunlight, initial encounter: Secondary | ICD-10-CM

## 2022-11-28 DIAGNOSIS — L570 Actinic keratosis: Secondary | ICD-10-CM

## 2022-11-28 DIAGNOSIS — Z5111 Encounter for antineoplastic chemotherapy: Secondary | ICD-10-CM

## 2022-11-28 DIAGNOSIS — L821 Other seborrheic keratosis: Secondary | ICD-10-CM

## 2022-11-28 DIAGNOSIS — Z7189 Other specified counseling: Secondary | ICD-10-CM

## 2022-11-28 DIAGNOSIS — D485 Neoplasm of uncertain behavior of skin: Secondary | ICD-10-CM

## 2022-11-28 NOTE — Progress Notes (Signed)
Follow-Up Visit   Subjective  Jackson BYNES Sr. is a 72 y.o. male who presents for the following: Irregular, irritated skin lesion on the L post thigh, patient here today for surgical excision. The patient has spots, moles and lesions to be evaluated, some may be new or changing.   The following portions of the chart were reviewed this encounter and updated as appropriate: medications, allergies, medical history  Review of Systems:  No other skin or systemic complaints except as noted in HPI or Assessment and Plan.  Objective  Well appearing patient in no apparent distress; mood and affect are within normal limits.  A focused examination was performed of the following areas: The legs and buttocks   Relevant exam findings are noted in the Assessment and Plan.  Left Thigh - Posterior 2.1 cm flesh colored papule.  Face x 7 (7) Erythematous stuck-on, waxy papule or plaque  R temple Pink biopsy site.    Assessment & Plan     Neoplasm of uncertain behavior of skin Left Thigh - Posterior  Skin excision  Lesion length (cm):  2.1 Lesion width (cm):  2.1 Margin per side (cm):  0 Total excision diameter (cm):  2.1 Informed consent: discussed and consent obtained   Timeout: patient name, date of birth, surgical site, and procedure verified   Procedure prep:  Patient was prepped and draped in usual sterile fashion Prep type:  Isopropyl alcohol and povidone-iodine Anesthesia: the lesion was anesthetized in a standard fashion   Anesthetic:  1% lidocaine w/ epinephrine 1-100,000 buffered w/ 8.4% NaHCO3 (3 cc) Instrument used: #15 blade   Hemostasis achieved with: pressure, aluminum chloride and electrodesiccation   Hemostasis achieved with comment:  Electrocautery Outcome: patient tolerated procedure well with no complications   Post-procedure details: sterile dressing applied and wound care instructions given   Dressing type: bandage and pressure dressing    Specimen 1  - Surgical pathology Differential Diagnosis: D48.5 r/o fibrolipoma vs other Check Margins: No  Inflamed seborrheic keratosis (7) Face x 7  Destruction of lesion - Face x 7 Complexity: simple   Destruction method: cryotherapy   Informed consent: discussed and consent obtained   Timeout:  patient name, date of birth, surgical site, and procedure verified Lesion destroyed using liquid nitrogen: Yes   Region frozen until ice ball extended beyond lesion: Yes   Outcome: patient tolerated procedure well with no complications   Post-procedure details: wound care instructions given    Basal cell carcinoma (BCC) of skin of other part of face R temple  Patient scheduled for Mohs procedure.   ACTINIC DAMAGE WITH PRECANCEROUS ACTINIC KERATOSES Counseling for Topical Chemotherapy Management: Patient exhibits: - Severe, confluent actinic changes with pre-cancerous actinic keratoses that is secondary to cumulative UV radiation exposure over time - Condition that is severe; chronic, not at goal. - diffuse scaly erythematous macules and papules with underlying dyspigmentation - Discussed Prescription "Field Treatment" topical Chemotherapy for Severe, Chronic Confluent Actinic Changes with Pre-Cancerous Actinic Keratoses Field treatment involves treatment of an entire area of skin that has confluent Actinic Changes (Sun/ Ultraviolet light damage) and PreCancerous Actinic Keratoses by method of PhotoDynamic Therapy (PDT) and/or prescription Topical Chemotherapy agents such as 5-fluorouracil, 5-fluorouracil/calcipotriene, and/or imiquimod.  The purpose is to decrease the number of clinically evident and subclinical PreCancerous lesions to prevent progression to development of skin cancer by chemically destroying early precancer changes that may or may not be visible.  It has been shown to reduce the risk of developing  skin cancer in the treated area. As a result of treatment, redness, scaling, crusting, and  open sores may occur during treatment course. One or more than one of these methods may be used and may have to be used several times to control, suppress and eliminate the PreCancerous changes. Discussed treatment course, expected reaction, and possible side effects. - Recommend daily broad spectrum sunscreen SPF 30+ to sun-exposed areas, reapply every 2 hours as needed.  - Staying in the shade or wearing long sleeves, sun glasses (UVA+UVB protection) and wide brim hats (4-inch brim around the entire circumference of the hat) are also recommended. - Call for new or changing lesions. - Pt to use 5FU/Calcipotriene mix BID x 7 days to aa's scalp.  SEBORRHEIC KERATOSIS - Stuck-on, waxy, tan-brown papules and/or plaques  - Benign-appearing - Discussed benign etiology and prognosis. - Observe - Call for any changes  Return for appointment as scheduled.  Maylene Roes, CMA, am acting as scribe for Armida Sans, MD .  Documentation: I have reviewed the above documentation for accuracy and completeness, and I agree with the above.  Armida Sans, MD

## 2022-11-28 NOTE — Patient Instructions (Addendum)
Wound Care Instructions  Cleanse wound gently with soap and water once a day then pat dry with clean gauze. Apply a thin coat of Petrolatum (petroleum jelly, "Vaseline") over the wound (unless you have an allergy to this). We recommend that you use a new, sterile tube of Vaseline. Do not pick or remove scabs. Do not remove the yellow or white "healing tissue" from the base of the wound.  Cover the wound with fresh, clean, nonstick gauze and secure with paper tape. You may use Band-Aids in place of gauze and tape if the wound is small enough, but would recommend trimming much of the tape off as there is often too much. Sometimes Band-Aids can irritate the skin.  You should call the office for your biopsy report after 1 week if you have not already been contacted.  If you experience any problems, such as abnormal amounts of bleeding, swelling, significant bruising, significant pain, or evidence of infection, please call the office immediately.  FOR ADULT SURGERY PATIENTS: If you need something for pain relief you may take 1 extra strength Tylenol (acetaminophen) AND 2 Ibuprofen (200mg each) together every 4 hours as needed for pain. (do not take these if you are allergic to them or if you have a reason you should not take them.) Typically, you may only need pain medication for 1 to 3 days.     Due to recent changes in healthcare laws, you may see results of your pathology and/or laboratory studies on MyChart before the doctors have had a chance to review them. We understand that in some cases there may be results that are confusing or concerning to you. Please understand that not all results are received at the same time and often the doctors may need to interpret multiple results in order to provide you with the best plan of care or course of treatment. Therefore, we ask that you please give us 2 business days to thoroughly review all your results before contacting the office for clarification. Should  we see a critical lab result, you will be contacted sooner.   If You Need Anything After Your Visit  If you have any questions or concerns for your doctor, please call our main line at [phone] and press option 4 to reach your doctor's medical assistant. If no one answers, please leave a voicemail as directed and we will return your call as soon as possible. Messages left after 4 pm will be answered the following business day.   You may also send us a message via MyChart. We typically respond to MyChart messages within 1-2 business days.  For prescription refills, please ask your pharmacy to contact our office. Our fax number is [phone].  If you have an urgent issue when the clinic is closed that cannot wait until the next business day, you can page your doctor at the number below.    Please note that while we do our best to be available for urgent issues outside of office hours, we are not available 24/7.   If you have an urgent issue and are unable to reach us, you may choose to seek medical care at your doctor's office, retail clinic, urgent care center, or emergency room.  If you have a medical emergency, please immediately call 911 or go to the emergency department.  Pager Numbers  - Dr. Kowalski: [phone]  - Dr. Moye: [phone]  - Dr. Stewart: [phone]  In the event of inclement weather, please call our main line at   [phone] for an update on the status of any delays or closures.  Dermatology Medication Tips: Please keep the boxes that topical medications come in in order to help keep track of the instructions about where and how to use these. Pharmacies typically print the medication instructions only on the boxes and not directly on the medication tubes.   If your medication is too expensive, please contact our office at [phone] option 4 or send us a message through MyChart.   We are unable to tell what your co-pay for medications will be in  advance as this is different depending on your insurance coverage. However, we may be able to find a substitute medication at lower cost or fill out paperwork to get insurance to cover a needed medication.   If a prior authorization is required to get your medication covered by your insurance company, please allow us 1-2 business days to complete this process.  Drug prices often vary depending on where the prescription is filled and some pharmacies may offer cheaper prices.  The website www.goodrx.com contains coupons for medications through different pharmacies. The prices here do not account for what the cost may be with help from insurance (it may be cheaper with your insurance), but the website can give you the price if you did not use any insurance.  - You can print the associated coupon and take it with your prescription to the pharmacy.  - You may also stop by our office during regular business hours and pick up a GoodRx coupon card.  - If you need your prescription sent electronically to a different pharmacy, notify our office through [facility] MyChart or by phone at [phone] option 4.     Si Usted Necesita Algo Despus de Su Visita  Tambin puede enviarnos un mensaje a travs de MyChart. Por lo general respondemos a los mensajes de MyChart en el transcurso de 1 a 2 das hbiles.  Para renovar recetas, por favor pida a su farmacia que se ponga en contacto con nuestra oficina. Nuestro nmero de fax es el [phone].  Si tiene un asunto urgente cuando la clnica est cerrada y que no puede esperar hasta el siguiente da hbil, puede llamar/localizar a su doctor(a) al nmero que aparece a continuacin.   Por favor, tenga en cuenta que aunque hacemos todo lo posible para estar disponibles para asuntos urgentes fuera del horario de oficina, no estamos disponibles las 24 horas del da, los 7 das de la semana.   Si tiene un problema urgente y no puede comunicarse con nosotros, puede  optar por buscar atencin mdica  en el consultorio de su doctor(a), en una clnica privada, en un centro de atencin urgente o en una sala de emergencias.  Si tiene una emergencia mdica, por favor llame inmediatamente al 911 o vaya a la sala de emergencias.  Nmeros de bper  - Dr. Kowalski: [phone]  - Dra. Moye: [phone]  - Dra. Stewart: [phone]  En caso de inclemencias del tiempo, por favor llame a nuestra lnea principal al [phone] para una actualizacin sobre el estado de cualquier retraso o cierre.  Consejos para la medicacin en dermatologa: Por favor, guarde las cajas en las que vienen los medicamentos de uso tpico para ayudarle a seguir las instrucciones sobre dnde y cmo usarlos. Las farmacias generalmente imprimen las instrucciones del medicamento slo en las cajas y no directamente en los tubos del medicamento.   Si su medicamento es muy caro, por favor, pngase en contacto con   nuestra oficina llamando al [phone] y presione la opcin 4 o envenos un mensaje a travs de MyChart.   No podemos decirle cul ser su copago por los medicamentos por adelantado ya que esto es diferente dependiendo de la cobertura de su seguro. Sin embargo, es posible que podamos encontrar un medicamento sustituto a menor costo o llenar un formulario para que el seguro cubra el medicamento que se considera necesario.   Si se requiere una autorizacin previa para que su compaa de seguros cubra su medicamento, por favor permtanos de 1 a 2 das hbiles para completar este proceso.  Los precios de los medicamentos varan con frecuencia dependiendo del lugar de dnde se surte la receta y alguna farmacias pueden ofrecer precios ms baratos.  El sitio web www.goodrx.com tiene cupones para medicamentos de diferentes farmacias. Los precios aqu no tienen en cuenta lo que podra costar con la ayuda del seguro (puede ser ms barato con su seguro), pero el sitio web puede darle el  precio si no utiliz ningn seguro.  - Puede imprimir el cupn correspondiente y llevarlo con su receta a la farmacia.  - Tambin puede pasar por nuestra oficina durante el horario de atencin regular y recoger una tarjeta de cupones de GoodRx.  - Si necesita que su receta se enve electrnicamente a una farmacia diferente, informe a nuestra oficina a travs de MyChart de [facility] o por telfono llamando al [phone] y presione la opcin 4.  

## 2022-12-01 ENCOUNTER — Ambulatory Visit
Admission: RE | Admit: 2022-12-01 | Discharge: 2022-12-01 | Disposition: A | Discharge status: Home / Self Care | Payer: Medicare HMO | Source: Ambulatory Visit | Attending: Internal Medicine | Admitting: Internal Medicine

## 2022-12-01 DIAGNOSIS — E041 Nontoxic single thyroid nodule: Secondary | ICD-10-CM

## 2022-12-04 ENCOUNTER — Telehealth: Payer: Self-pay

## 2022-12-04 NOTE — Telephone Encounter (Signed)
Called pt discussed biopsy results  

## 2022-12-04 NOTE — Telephone Encounter (Signed)
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

## 2022-12-05 ENCOUNTER — Encounter: Payer: Self-pay | Admitting: Dermatology

## 2022-12-07 ENCOUNTER — Encounter: Payer: Self-pay | Admitting: Dietician

## 2022-12-07 ENCOUNTER — Encounter: Payer: Medicare HMO | Attending: Internal Medicine | Admitting: Dietician

## 2022-12-07 DIAGNOSIS — Z713 Dietary counseling and surveillance: Secondary | ICD-10-CM | POA: Insufficient documentation

## 2022-12-07 DIAGNOSIS — E119 Type 2 diabetes mellitus without complications: Secondary | ICD-10-CM | POA: Diagnosis present

## 2022-12-07 NOTE — Progress Notes (Signed)
Diabetes Self-Management Education  Visit Type: Follow-up  Appt. Start Time: 1400 Appt. End Time: 1510  12/07/2022  Jackson Jordan, identified by name and date of birth, is a 72 y.o. male with a diagnosis of Diabetes:  .   ASSESSMENT Pt is present for refresher appointment, states that they want to narrow down their dietary plan to control their BG, portions are important to them, wants to eat appropriate portions sizes.  Pt reports starting a physical activity regiment in the last week, wants to strengthen their legs to get  a knee replacement, and to remain active as they age. Pt not currently taking any DM medications, previously taking glimepiride, wants to pursue glucose control through diet and exercise. Pt reports previous bouts of hypoglycemia that became frustrating. Pt has glucometer but is not checking, would only check if they were symptomatic. Sleeps 10:30 -11:30, wakes up 7:00 - 8:00 am. Pt is on CPAP, wears regularly. First meal ~11:00 am, ~4:00 pm next meal, which is usually their only meal after breakfast and is a large meal. Pt reports trying to do Mental, Physical, Emotional, Spiritual enriching for themselves each day. There were no vitals taken for this visit. There is no height or weight on file to calculate BMI.   Diabetes Self-Management Education - 12/07/22 1348       Visit Information   Visit Type Follow-up      Psychosocial Assessment   Patient Belief/Attitude about Diabetes Motivated to manage diabetes    What is the hardest part about your diabetes right now, causing you the most concern, or is the most worrisome to you about your diabetes?   Making healty food and beverage choices;Checking blood sugar;Being active    Self-care barriers None    Self-management support Doctor's office;Church    Other persons present Patient    Patient Concerns Nutrition/Meal planning;Healthy Lifestyle    Special Needs None    Preferred Learning Style Visual;Hands on     Learning Readiness Ready    How often do you need to have someone help you when you read instructions, pamphlets, or other written materials from your doctor or pharmacy? 1 - Never    What is the last grade level you completed in school? college      Pre-Education Assessment   Patient understands the diabetes disease and treatment process. Needs Review    Patient understands incorporating nutritional management into lifestyle. Needs Review    Patient undertands incorporating physical activity into lifestyle. Needs Review    Patient understands using medications safely. Needs Review    Patient understands monitoring blood glucose, interpreting and using results Needs Review    Patient understands prevention, detection, and treatment of acute complications. Needs Review    Patient understands prevention, detection, and treatment of chronic complications. Needs Review    Patient understands how to develop strategies to address psychosocial issues. Needs Review    Patient understands how to develop strategies to promote health/change behavior. Needs Review      Complications   Last HgB A1C per patient/outside source 7.1 %   10/30/22   How often do you check your blood sugar? 0 times/day (not testing)    Number of hypoglycemic episodes per month 2   Occasional bouts of hypo when taking glimepiride   Can you tell when your blood sugar is low? Yes    What do you do if your blood sugar is low? eat      Dietary Intake   Breakfast 3  slices of bacon, 3 scrambled eggs, grits, 2% milk    Lunch [place] strip steak, baked potato w/ plant butter,    Snack (evening) Banana and PB    Beverage(s) milk, diet Dr. Katina Degree, diet green tea, water      Activity / Exercise   Activity / Exercise Type Moderate (swimming / aerobic walking)   Sationary bike   How many days per week do you exercise? 3    How many minutes per day do you exercise? 20    Total minutes per week of exercise 60      Patient  Education   Previous Diabetes Education Yes (please comment)   DME in facility   Disease Pathophysiology Explored patient's options for treatment of their diabetes    Healthy Eating Role of diet in the treatment of diabetes and the relationship between the three main macronutrients and blood glucose level;Food label reading, portion sizes and measuring food.;Plate Method;Reviewed blood glucose goals for pre and post meals and how to evaluate the patients' food intake on their blood glucose level.    Being Active Role of exercise on diabetes management, blood pressure control and cardiac health.    Monitoring Purpose and frequency of SMBG.;Identified appropriate SMBG and/or A1C goals.    Lifestyle and Health Coping Lifestyle issues that need to be addressed for better diabetes care      Individualized Goals (developed by patient)   Nutrition Follow meal plan discussed;General guidelines for healthy choices and portions discussed    Physical Activity Exercise 3-5 times per week    Medications Not Applicable    Monitoring  Test my blood glucose as discussed    Problem Solving Eating Pattern    Health Coping Ask for help with psychological, social, or emotional issues      Patient Self-Evaluation of Goals - Patient rates self as meeting previously set goals (% of time)   Nutrition < 25% (hardly ever/never)    Physical Activity 25 - 50% (sometimes)    Medications 25 - 50% (sometimes)    Monitoring < 25% (hardly ever/never)    Problem Solving and behavior change strategies  25 - 50% (sometimes)    Reducing Risk (treating acute and chronic complications) 25 - 50% (sometimes)    Health Coping 25 - 50% (sometimes)      Post-Education Assessment   Patient understands the diabetes disease and treatment process. Needs Review    Patient understands incorporating nutritional management into lifestyle. Needs Review    Patient undertands incorporating physical activity into lifestyle. Needs Review     Patient understands using medications safely. Needs Review    Patient understands monitoring blood glucose, interpreting and using results Needs Review    Patient understands prevention, detection, and treatment of acute complications. Needs Review    Patient understands prevention, detection, and treatment of chronic complications. Needs Review    Patient understands how to develop strategies to address psychosocial issues. Needs Review    Patient understands how to develop strategies to promote health/change behavior. Needs Review      Outcomes   Expected Outcomes Demonstrated interest in learning. Expect positive outcomes    Future DMSE 3-4 months    Program Status Not Completed      Subsequent Visit   Since your last visit have you continued or begun to take your medications as prescribed? Yes   Doctor temporarily halted glimepiride   Since your last visit have you had your blood pressure checked? Yes  Is your most recent blood pressure lower, unchanged, or higher since your last visit? Higher    Since your last visit, are you checking your blood glucose at least once a day? No             Individualized Plan for Diabetes Self-Management Training:   Learning Objective:  Patient will have a greater understanding of diabetes self-management. Patient education plan is to attend individual and/or group sessions per assessed needs and concerns.   Plan:   Patient Instructions  Check your blood sugar each morning before you eat or drink anything. Prick your finger on the side at the same level as the base of your fingernail. Your goal reading is below 124 mg/dL.  Try to eat something within an hour waking up.  Eat three meals a day 4-5 hours apart 1/2 plate non-starchy vegetables, 1/4 plate lean protein, 1/4 plate starches Use your food list to keep a variety of options at each meal.  Continue to great job with physical activity!! Your goal is work up to 30 minutes at each  time you are active!  Choose leaner (low-fat) meats, specifically beef and pork!  When having eggs for breakfast, only have 1-2 egg yolks instead of 3+!  Expected Outcomes:  Demonstrated interest in learning. Expect positive outcomes  Education material provided: My Plate  If problems or questions, patient to contact team via:  Phone and Email  Future DSME appointment: 3-4 months

## 2022-12-07 NOTE — Patient Instructions (Addendum)
Check your blood sugar each morning before you eat or drink anything. Prick your finger on the side at the same level as the base of your fingernail. Your goal reading is below 124 mg/dL.  Try to eat something within an hour waking up.  Eat three meals a day 4-5 hours apart 1/2 plate non-starchy vegetables, 1/4 plate lean protein, 1/4 plate starches Use your food list to keep a variety of options at each meal.  Continue to great job with physical activity!! Your goal is work up to 30 minutes at each time you are active!  Choose leaner (low-fat) meats, specifically beef and pork!  When having eggs for breakfast, only have 1-2 egg yolks instead of 3+!

## 2022-12-12 ENCOUNTER — Ambulatory Visit: Payer: Medicare HMO | Admitting: Dermatology

## 2023-02-19 ENCOUNTER — Ambulatory Visit: Payer: Medicare HMO | Admitting: Dietician

## 2023-03-15 ENCOUNTER — Ambulatory Visit: Payer: Medicare HMO | Admitting: Dietician

## 2023-03-28 ENCOUNTER — Other Ambulatory Visit: Payer: Self-pay | Admitting: Internal Medicine

## 2023-03-28 DIAGNOSIS — R918 Other nonspecific abnormal finding of lung field: Secondary | ICD-10-CM

## 2023-04-03 ENCOUNTER — Ambulatory Visit
Admission: RE | Admit: 2023-04-03 | Discharge: 2023-04-03 | Disposition: A | Discharge status: Home / Self Care | Payer: Medicare HMO | Source: Ambulatory Visit | Attending: Internal Medicine | Admitting: Internal Medicine

## 2023-04-03 DIAGNOSIS — R918 Other nonspecific abnormal finding of lung field: Secondary | ICD-10-CM

## 2023-04-06 ENCOUNTER — Ambulatory Visit: Payer: Medicare HMO

## 2023-04-06 DIAGNOSIS — K5730 Diverticulosis of large intestine without perforation or abscess without bleeding: Secondary | ICD-10-CM | POA: Diagnosis not present

## 2023-04-06 DIAGNOSIS — Z86010 Personal history of colonic polyps: Secondary | ICD-10-CM | POA: Diagnosis not present

## 2023-04-06 DIAGNOSIS — K641 Second degree hemorrhoids: Secondary | ICD-10-CM | POA: Diagnosis not present

## 2023-04-06 DIAGNOSIS — Z09 Encounter for follow-up examination after completed treatment for conditions other than malignant neoplasm: Secondary | ICD-10-CM | POA: Diagnosis present

## 2023-05-09 ENCOUNTER — Encounter: Payer: Self-pay | Admitting: Dermatology

## 2023-05-09 ENCOUNTER — Ambulatory Visit: Payer: Medicare HMO | Admitting: Dermatology

## 2023-05-09 VITALS — BP 175/90

## 2023-05-09 DIAGNOSIS — L570 Actinic keratosis: Secondary | ICD-10-CM | POA: Diagnosis not present

## 2023-05-09 DIAGNOSIS — L820 Inflamed seborrheic keratosis: Secondary | ICD-10-CM | POA: Diagnosis not present

## 2023-05-09 DIAGNOSIS — L578 Other skin changes due to chronic exposure to nonionizing radiation: Secondary | ICD-10-CM | POA: Diagnosis not present

## 2023-05-09 DIAGNOSIS — W908XXA Exposure to other nonionizing radiation, initial encounter: Secondary | ICD-10-CM

## 2023-05-09 NOTE — Patient Instructions (Addendum)

## 2023-05-09 NOTE — Progress Notes (Signed)
Follow-Up Visit   Subjective  Jackson SAVINO Sr. is a 72 y.o. male who presents for the following: AK 22m f/u, scalp, face, used 5FU/Calcipotriene cr to scalp with good reaction The patient has spots, moles and lesions to be evaluated, some may be new or changing and the patient may have concern these could be cancer.   The following portions of the chart were reviewed this encounter and updated as appropriate: medications, allergies, medical history  Review of Systems:  No other skin or systemic complaints except as noted in HPI or Assessment and Plan.  Objective  Well appearing patient in no apparent distress; mood and affect are within normal limits.   A focused examination was performed of the following areas: Face, scalp  Relevant exam findings are noted in the Assessment and Plan.  Scalp x 16, face x 2 (18) Pink scaly macules  R cheek x 1, face x 2 (3) Stuck on waxy paps with erythema    Assessment & Plan   ACTINIC DAMAGE - chronic, secondary to cumulative UV radiation exposure/sun exposure over time - diffuse scaly erythematous macules with underlying dyspigmentation - Recommend daily broad spectrum sunscreen SPF 30+ to sun-exposed areas, reapply every 2 hours as needed.  - Recommend staying in the shade or wearing long sleeves, sun glasses (UVA+UVB protection) and wide brim hats (4-inch brim around the entire circumference of the hat). - Call for new or changing lesions.   AK (actinic keratosis) (18) Scalp x 16, face x 2  Actinic keratoses are precancerous spots that appear secondary to cumulative UV radiation exposure/sun exposure over time. They are chronic with expected duration over 1 year. A portion of actinic keratoses will progress to squamous cell carcinoma of the skin. It is not possible to reliably predict which spots will progress to skin cancer and so treatment is recommended to prevent development of skin cancer.  Recommend daily broad spectrum  sunscreen SPF 30+ to sun-exposed areas, reapply every 2 hours as needed.  Recommend staying in the shade or wearing long sleeves, sun glasses (UVA+UVB protection) and wide brim hats (4-inch brim around the entire circumference of the hat). Call for new or changing lesions.  Destruction of lesion - Scalp x 16, face x 2 (18) Complexity: simple   Destruction method: cryotherapy   Informed consent: discussed and consent obtained   Timeout:  patient name, date of birth, surgical site, and procedure verified Lesion destroyed using liquid nitrogen: Yes   Region frozen until ice ball extended beyond lesion: Yes   Outcome: patient tolerated procedure well with no complications   Post-procedure details: wound care instructions given    Inflamed seborrheic keratosis (3) R cheek x 1, face x 2  Symptomatic, irritating, patient would like treated.  Destruction of lesion - R cheek x 1, face x 2 (3) Complexity: simple   Destruction method: cryotherapy   Informed consent: discussed and consent obtained   Timeout:  patient name, date of birth, surgical site, and procedure verified Lesion destroyed using liquid nitrogen: Yes   Region frozen until ice ball extended beyond lesion: Yes   Outcome: patient tolerated procedure well with no complications   Post-procedure details: wound care instructions given      Return in about 6 months (around 11/07/2023) for TBSE, Hx of BCC, Hx of Dysplastic nevi, Hx of AKs.  I, Ardis Rowan, RMA, am acting as scribe for Armida Sans, MD .   Documentation: I have reviewed the above documentation for accuracy and  completeness, and I agree with the above.  Armida Sans, MD

## 2023-05-20 ENCOUNTER — Encounter: Payer: Self-pay | Admitting: Dermatology

## 2023-06-12 ENCOUNTER — Telehealth: Payer: Self-pay | Admitting: Endocrinology

## 2023-06-12 ENCOUNTER — Other Ambulatory Visit: Payer: Self-pay

## 2023-06-12 ENCOUNTER — Encounter: Payer: Self-pay | Admitting: Endocrinology

## 2023-06-12 ENCOUNTER — Ambulatory Visit: Payer: Medicare HMO | Admitting: Endocrinology

## 2023-06-12 VITALS — BP 120/70 | HR 78 | Resp 20 | Ht 70.0 in | Wt 261.0 lb

## 2023-06-12 DIAGNOSIS — R7989 Other specified abnormal findings of blood chemistry: Secondary | ICD-10-CM

## 2023-06-12 DIAGNOSIS — E041 Nontoxic single thyroid nodule: Secondary | ICD-10-CM | POA: Diagnosis not present

## 2023-06-12 DIAGNOSIS — E042 Nontoxic multinodular goiter: Secondary | ICD-10-CM | POA: Diagnosis not present

## 2023-06-12 NOTE — Telephone Encounter (Signed)
Sherry with [facility] called to say patient is scheduled for 3 thyroid biopsies and per their protocol they can only do 2 at a time.  Per Dr.Thapa, due to clinical staff being unavailable, he wants the first 2 done first.  These 2 are the largest of the 3.  Per Dr. Erroll Luna the third biopsy can be scheduled at a later date.  Information was given to [facility] at [facility].

## 2023-06-12 NOTE — Progress Notes (Signed)
Outpatient Endocrinology Note [name], MD  06/12/23  Patient's Name: Jackson MAULLER Sr.    DOB: 07-31-1950    MRN: [id number]  REASON OF VISIT: New consult for thyroid nodules   PCP: Marguarite Arbour, MD  REFERRING PROVIDER: Bethanie Dicker, MD   HISTORY OF PRESENT ILLNESS:   Jackson ARTIAGA Sr. is a 72 y.o. old male with past medical history as listed below is presented for new consult for thyroid nodules.  Pertinent Thyroid History:  In May 2024 patient had vision issue, had carotid ultrasound at [facility] and found to have thyroid nodules, he subsequently had ultrasound thyroid on Dec 01, 2022, showed multiple left thyroid nodules largest left thyroid nodule measuring 7.8 cm solid, heterogenous, left inferior thyroid nodule measuring 4.5 cm and left mid thyroid nodule measuring 3.6 cm in maximal dimension, there are other smaller left thyroid nodules.  He had right thyroid lobe, status post right hemithyroidectomy in early 2000. Patient reports he had s/p right hemithyroidectomy, at [facility], 2004 for thyroid mass, reportedly not cancer.  No records available to review. 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 -Patient was seen by endocrinology at [facility] [place], [facility], by Dr. Standley Brooking in June 2024, was planned for biopsy of 2 of larger left thyroid nodules.  Patient reports he had biopsy however sample was not enough and was not able to get the cytology report.  No records available to review.  - Patient has occasional neck discomfort.  Patient complains of increasing neck discomfort, and pressure effect especially while wearing tie.  He has obstructive sleep apnea on CPAP.  He also have increasing fatigue.  Ultrasound thyroid: Dec 01, 2022 reviewed images.  CLINICAL  DATA:  Nodule.  Previous hemithyroidectomy 2004.   EXAM: THYROID ULTRASOUND   TECHNIQUE: Ultrasound examination of the thyroid gland and adjacent soft tissues was performed.   COMPARISON:  None Available.   FINDINGS: Parenchymal Echotexture: Markedly heterogenous   Isthmus: 0.5 cm thickness   Right lobe: Surgically absent   Left lobe: 10 x 2.6 x 2.8 cm   _________________________________________________________   Estimated total number of nodules >/= 1 cm: 5   Number of spongiform nodules >/=  2 cm not described below (TR1): 0   Number of mixed cystic and solid nodules >/= 1.5 cm not described below (TR2): 0   _________________________________________________________   Nodule # 1:   Location: Left; inferior   Maximum size: 7.8 cm; Other 2 dimensions: 6.8 x 7.8 cm   Composition: solid/almost completely solid (2)   Echogenicity: isoechoic (1)   Shape: not taller-than-wide (0)   Margins: ill-defined (0)   Echogenic foci: none (0)   ACR TI-RADS total points: 3.   ACR TI-RADS risk category: TR 3.   ACR TI-RADS recommendations:   **Given size (>/= 2.5 cm) and appearance, fine needle aspiration of this mildly suspicious nodule should be considered based on TI-RADS criteria.   _________________________________________________________   Nodule # 2:   Location: Left; inferior   Maximum size: 4.5 cm; Other 2 dimensions: 3.9 x 4.3 cm   Composition: solid/almost completely solid (2)   Echogenicity: isoechoic (1)   Shape: not taller-than-wide (0)   Margins: ill-defined (0)   Echogenic foci: none (0)   ACR TI-RADS total points: 3.   ACR TI-RADS  risk category: TR 3.   ACR TI-RADS recommendations:   **Given size (>/= 2.5 cm) and appearance, fine needle aspiration of this mildly suspicious nodule should be considered based on TI-RADS criteria.   _________________________________________________________   Nodule # 3:   Location: Left; medial    Maximum size: 1.7 cm; Other 2 dimensions: 1.2 x 1.3 cm   Composition: solid/almost completely solid (2)   Echogenicity: isoechoic (1)   Shape: not taller-than-wide (0)   Margins: ill-defined (0)   Echogenic foci: none (0)   ACR TI-RADS total points: 3.   ACR TI-RADS risk category: TR 3.   ACR TI-RADS recommendations:   *Given size (>/= 1.5 - 2.4 cm) and appearance, a follow-up ultrasound in 1 year should be considered based on TI-RADS criteria.   _________________________________________________________   Nodule # 4:   Location: Left; mid   Maximum size: 3.6 cm; Other 2 dimensions: 3.4 x 3.4 cm   Composition: solid/almost completely solid (2)   Echogenicity: isoechoic (1)   Shape: not taller-than-wide (0)   Margins: ill-defined (0)   Echogenic foci: none (0)   ACR TI-RADS total points: 3.   ACR TI-RADS risk category: TR 3.   ACR TI-RADS recommendations:   **Given size (>/= 2.5 cm) and appearance, fine needle aspiration of this mildly suspicious nodule should be considered based on TI-RADS criteria.   _________________________________________________________   Nodule # 5:   Location: Left; superior   Maximum size: 2.1 cm; Other 2 dimensions: 1.1 x 1.9 cm   Composition: solid/almost completely solid (2)   Echogenicity: isoechoic (1)   Shape: not taller-than-wide (0)   Margins: ill-defined (0)   Echogenic foci: none (0)   ACR TI-RADS total points: 3.   ACR TI-RADS risk category: TR 3.   ACR TI-RADS recommendations:   *Given size (>/= 1.5 - 2.4 cm) and appearance, a follow-up ultrasound in 1 year should be considered based on TI-RADS criteria.   _________________________________________________________   No regional cervical adenopathy identified.   IMPRESSION: 1. No residual/recurrent tissue post right hemithyroidectomy. 2. Thyromegaly with multiple left mildly suspicious nodules. In the absence of previous studies demonstrating  stability, current guidelines recommend biopsy of the 2 largest lesions. 3. Recommend annual/biennial ultrasound follow-up of additional nodules as above, until stability x5 years confirmed.    -No family history of thyroid cancer.  She has half sister with thyroid nodule and going for thyroid biopsy as well. -He has remained euthyroid, has never been on thyroid medication despite having right hemithyroidectomy. - Patient denies h/o significant radiation exposure or radiation treatment in the neck.  - Labs:  He is to have normal thyroid function test in the past.  However in June 2024 had mildly low TSH of 0.404 and normal free T4 of 1.02, in [facility].  # Patient has type 2 diabetes mellitus, on glimepiride, managed by primary care provider.  Interval history  Patient presented for evaluation of multiple thyroid nodules.  REVIEW OF SYSTEMS:  As per history of present illness.   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   Cancer (HCC)    skin   Coronary artery disease    Diabetes mellitus without complication (HCC)    Dysplastic nevus 12/27/2017   left inf buttock   Dysplastic nevus 08/11/2019   right post shoulder/deltoid  Hypertension    Hypothyroidism    Lower back pain    Pre-diabetes    Prostate enlargement    Sleep apnea    CPAP    PAST SURGICAL HISTORY: Past Surgical History:  Procedure Laterality Date   broken wrist Right    CARDIAC CATHETERIZATION     x 2   CATARACT EXTRACTION W/PHACO Left 09/03/2018   Procedure: CATARACT EXTRACTION PHACO AND INTRAOCULAR LENS PLACEMENT (IOC);  Surgeon: Galen Manila, MD;  Location: [facility];  Service: Ophthalmology;  Laterality: Left;  [place] 00:26.8 CDE 3.12 Fluid Pack Lot # [id number]   CATARACT EXTRACTION W/PHACO Right 12/03/2018   Procedure: CATARACT EXTRACTION PHACO AND  INTRAOCULAR LENS PLACEMENT (IOC)  RIGHT;  Surgeon: Galen Manila, MD;  Location: [facility];  Service: Ophthalmology;  Laterality: Right;   CORONARY ANGIOPLASTY     STENT X 2   CORONARY STENT INTERVENTION N/A 04/15/2019   Procedure: CORONARY STENT INTERVENTION;  Surgeon: Marcina Millard, MD;  Location: [facility];  Service: Cardiovascular;  Laterality: N/A;   HERNIA REPAIR     JOINT REPLACEMENT  05/2018   TKR   KNEE ARTHROPLASTY Left 06/03/2018   Procedure: COMPUTER ASSISTED TOTAL KNEE ARTHROPLASTY;  Surgeon: Donato Heinz, MD;  Location: [facility];  Service: Orthopedics;  Laterality: Left;   LEFT HEART CATH AND CORONARY ANGIOGRAPHY Left 04/15/2019   Procedure: LEFT HEART CATH AND CORONARY ANGIOGRAPHY;  Surgeon: Marcina Millard, MD;  Location: [facility];  Service: Cardiovascular;  Laterality: Left;   SEPTOPLASTY     SKIN CANCER EXCISION     THYROID SURGERY      ALLERGIES: Allergies  Allergen Reactions   Metoprolol Shortness Of Breath    Hyperactivity    Meperidine Other (See Comments)    "I felt like I was leaving this world." Confusion   Doxycycline Palpitations    Difficulty sleeping, felt like his heart was beating in his stomach   Penicillins Itching, Rash and Other (See Comments)    Did it involve swelling of the face/tongue/throat, SOB, or low BP? No Did it involve sudden or severe rash/hives, skin peeling, or any reaction on the inside of your mouth or nose? No Did you need to seek medical attention at a [facility] or doctor's office? No When did it last happen?      30 Years If all above answers are "NO", may proceed with cephalosporin use.      FAMILY HISTORY:  Family History  Problem Relation Age of Onset   Diabetes Mother    Diabetes Brother     SOCIAL HISTORY: Social History   Socioeconomic History   Marital status: Married    Spouse name: June    Number of children: 4   Years of education: Not on file   Highest  education level: Not on file  Occupational History   Occupation: Programmer, multimedia   Tobacco Use   Smoking status: Former    Current packs/day: 0.00    Average packs/day: 2.0 packs/day for 10.0 years (20.0 ttl pk-yrs)    Types: Cigarettes    Start date: 05/22/1969    Quit date: 05/23/1979    Years since quitting: 44.0   Smokeless tobacco: Never  Vaping Use   Vaping status: Never Used  Substance and Sexual Activity   Alcohol use: Never   Drug use: Never   Sexual activity: Yes    Birth control/protection: None  Other Topics Concern   Not on file  Social History Narrative  Not on file   Social Determinants of Health   Financial Resource Strain: Low Risk  (04/15/2019)   Overall Financial Resource Strain (CARDIA)    Difficulty of Paying Living Expenses: Not very hard  Food Insecurity: No Food Insecurity (04/15/2019)   Hunger Vital Sign    Worried About Running Out of Food in the Last Year: Never true    Ran Out of Food in the Last Year: Never true  Transportation Needs: No Transportation Needs (04/15/2019)   PRAPARE - Administrator, Civil Service (Medical): No    Lack of Transportation (Non-Medical): No  Physical Activity: Unknown (04/15/2019)   Exercise Vital Sign    Days of Exercise per Week: 6 days    Minutes of Exercise per Session: Not on file  Stress: No Stress Concern Present (04/15/2019)   [employer] of Occupational Health - Occupational Stress Questionnaire    Feeling of Stress : Only a little  Social Connections: Unknown (04/15/2019)   Social Connection and Isolation Panel [NHANES]    Frequency of Communication with Friends and Family: More than three times a week    Frequency of Social Gatherings with Friends and Family: Not on file    Attends Religious Services: Not on file    Active Member of Clubs or Organizations: Not on file    Attends Banker Meetings: Not on file    Marital Status: Not on file    MEDICATIONS:  Current Outpatient  Medications  Medication Sig Dispense Refill   acetaminophen (TYLENOL) 500 MG tablet Take 500 mg by mouth daily as needed for moderate pain (pain score 4-6) (for pain.).     ALPRAZolam (XANAX) 0.5 MG tablet Take 0.5 mg by mouth at bedtime as needed for anxiety or sleep.      aspirin EC 81 MG tablet Take 81 mg by mouth daily.     azelastine (ASTELIN) 0.1 % nasal spray Place into the nose.     calcipotriene (DOVONOX) 0.005 % cream Apply topically 2 (two) times daily. 60 g 0   carvedilol (COREG) 6.25 MG tablet Take 6.25 mg by mouth 2 (two) times daily.     diltiazem (CARDIZEM CD) 240 MG 24 hr capsule Take 240 mg by mouth daily.     fluorouracil (EFUDEX) 5 % cream Apply topically 2 (two) times daily. 40 g 0   gabapentin (NEURONTIN) 400 MG capsule Take 400 mg by mouth at bedtime.     glimepiride (AMARYL) 2 MG tablet Take 0.5-1 tablets by mouth daily.     Ibuprofen-Acetaminophen (ADVIL DUAL ACTION) 125-250 MG TABS Take 2 tablets by mouth in the morning and at bedtime.     ketoconazole (NIZORAL) 2 % cream Apply 1 application. topically 2 (two) times daily as needed.     loperamide (IMODIUM) 2 MG capsule Take 2 mg by mouth as needed.     losartan (COZAAR) 25 MG tablet Take 25 mg by mouth daily.     Multiple Vitamins-Minerals (MULTIVITAMIN WITH MINERALS) tablet Take 1 tablet by mouth daily.      rosuvastatin (CRESTOR) 5 MG tablet Take 1 tablet by mouth daily.     tamsulosin (FLOMAX) 0.4 MG CAPS capsule Take by mouth.     triamcinolone (NASACORT) 55 MCG/ACT AERO nasal inhaler Place 2 sprays into the nose daily.     No current facility-administered medications for this visit.    PHYSICAL EXAM: Vitals:   06/12/23 1332  BP: 120/70  Pulse: 78  Resp: 20  SpO2: 96%  Weight: 261 lb (118.4 kg)  Height: 5\' 10"  (1.778 m)   Body mass index is 37.45 kg/m.  Wt Readings from Last 3 Encounters:  06/12/23 261 lb (118.4 kg)  03/02/22 262 lb 11.2 oz (119.2 kg)  11/14/21 265 lb 6.4 oz (120.4 kg)      General: Well developed, well nourished male in no apparent distress.  HEENT: AT/[place], no external lesions. Hearing intact to the spoken word Eyes: EOMI. No stare, proptosis. Conjunctiva clear and no icterus. Neck: Trachea rightward shift, neck supple without appreciable thyromegaly or lymphadenopathy and left palpable thyroid nodule +, anterior neck is scar present from thyroidectomy. Lungs: Clear to auscultation, no wheeze. Respirations not labored Heart: S1S2, Regular in rate and rhythm.  Abdomen: Soft, non tender, non distended Neurologic: Alert, oriented, normal speech, deep tendon biceps reflexes normal,  no gross focal neurological deficit Extremities: No pedal pitting edema, no tremors of outstretched hands Skin: Warm, color good.  Psychiatric: Does not appear depressed or anxious  PERTINENT HISTORIC LABORATORY AND IMAGING STUDIES:  All pertinent laboratory results were reviewed. Please see HPI also for further details.   TSH  Date Value Ref Range Status  06/04/2018 0.892 0.350 - 4.500 uIU/mL Final    Comment:    Performed by a 3rd Generation assay with a functional sensitivity of <=0.01 uIU/mL. Performed at [facility], [address], [place], [place] [address]      ASSESSMENT / PLAN  1. Multiple thyroid nodules   2. Thyroid nodule   3. Low TSH level    -Patient is known to have thyroid goiter, status post right hemithyroidectomy in August 2000, in 2004, with benign pathology reportedly.  On recent imaging test she was found to have multiple left thyroid nodules, last ultrasound was in May 2024.  With the available imaging studies, CT chest from 2019-2024 reviewed, left enlarged thyroid lobe, with retrosternal extension into mediastinum and rightward septal trachea. -Patient reports he had a needle biopsy of left thyroid nodule in June/July 2024 at [facility] however not enough sample and not able to get cytology report.  No records available to review. -Patient  has neck discomfort, reports is worsening.  He is clinically euthyroid and not on thyroid medication.  He had mildly low TSH since June 2024 in outside facility. -No family history of thyroid cancer.  No radiation exposure.  Plan: -Patient needs FNA of 3 of the left thyroid nodules, inferior left thyroid nodule measuring 7.8 cm solid, heterogenous, left inferior thyroid nodule measuring 4.5 cm and left mid thyroid nodule measuring 3.6 cm in maximal dimension.  Order placed.  IR contacted later that they will be able to do 2 nodules biopsy at that time.  Will plan for biopsy of left inferior thyroid nodules measuring 7.8 cm and 4.5 cm.  Will consider biopsying for thyroid nodule in the future date. -Discussed that based on his neck compressive symptoms provided biopsy is benign can be monitored closely without surgery, however thyroid surgery can be considered. -I like to check thyroid function test today.  He had mildly low TSH in June.  Thyroid nodule / FNA talk: -Approximately 5% of individuals have palpable thyroid nodules and 30% or more of adults have non-palpable nodules. The prevalence of nodules increases with age, so that perhaps 50% of individuals older than 73 years of age have nodules. Many of these nodules will be well under 1cm in size.  -The incidence of thyroid cancer is low, but rising. The prevalence  of thyroid cancer is low compared with the prevalence of thyroid nodule.  -There have been a number of studies that have estimated risk of malignancy in thyroid nodule. The estimated risk varies with size and other characteristics of nodules selected for biopsy, the technique used for the biopsy, the institution and its referral patterns, and the characteristics of the local population. Most studies have estimated malignancy risk in nodules that are palpable or > 1cm on U/S to be in the range of 3-15%.    - In general, options for further evaluation include: - Follow with serial U/S -  Fine needle aspiration biopsy - Thyroidectomy - In general the criteria for FNA biopsy includes:  - All palpable nodules - Generally nodules > 1 and for some nodules up to > 1 to 2 cm, based on other criteria.  - Rarely Nodules > 8-75mm in two or more dimensions with high risk sonographic features, or occuring in patients with high risk historical features. Nodules not meeting the above criteria can generally be followed with serial ultrasounds.  -Thyroid FNA is required for further evaluation of nodule to see if suspicious for cancer which may require further surgical treatment. -There are false positive and false negative results with FNA and need for repeat FNA or surgical resection in some cases and also possibility of missing a diagnosis of Cancer in 5-15% of cases. -discussed the risk and benefits of procedure and potential complications such as bleeding, pain in neck, swelling in neck in rare cases due to hematoma formation and causing respiratory compromise, possibility of infection etc. -discussed the need for continued follow up even if the nodule were found to be benign on FNA.  -discussed that the only near 100% method of ruling out thyroid cancer is by surgical resection. -patient agreeable for Thyroid FNA.  -Patient is referred to IR for FNA.  You may read about thyroid nodule from this link of [employer].  [email or web address].com.br  We have reviewed our plan outlined above with the patient verbalizes understanding.    All questions were answered.   Diagnoses and all orders for this visit:  Multiple thyroid nodules -     [place] FNA BX THYROID 1ST LESION AFIRMA; Future -     Cancel: [place] FNA BIOPSY THYROID EA ADD LESION AFIRMA; Future -     [place] FNA BIOPSY THYROID EA ADD LESION AFIRMA; Future -     [place] FNA BX THYROID 1ST LESION AFIRMA; Future  Thyroid nodule -     T3, free -     T4, free -     TSH  Low TSH level -     T3, free -     T4,  free -     TSH    DISPOSITION Follow up in clinic in 6 weeks suggested.  All questions answered and patient verbalized understanding of the plan.  [place] Miamor Ayler, MD [facility] [address] [place], [address] [place], [place] [address] Phone # [phone]  At least part of this note was generated using voice recognition software. Inadvertent word errors may have occurred, which were not recognized during the proofreading process.

## 2023-06-12 NOTE — Patient Instructions (Signed)
Referred to interventional radiology for needle biopsy of 3 left thyroid nodules.

## 2023-06-13 LAB — TSH: TSH: 0.5 m[IU]/L (ref 0.40–4.50)

## 2023-06-13 LAB — T4, FREE: Free T4: 1.2 ng/dL (ref 0.8–1.8)

## 2023-06-13 LAB — T3, FREE: T3, Free: 3.2 pg/mL (ref 2.3–4.2)

## 2023-07-12 ENCOUNTER — Ambulatory Visit
Admission: RE | Admit: 2023-07-12 | Discharge: 2023-07-12 | Disposition: A | Discharge status: Home / Self Care | Payer: Medicare HMO | Source: Ambulatory Visit | Attending: Endocrinology | Admitting: Endocrinology

## 2023-07-12 ENCOUNTER — Other Ambulatory Visit: Payer: Medicare HMO

## 2023-07-12 ENCOUNTER — Other Ambulatory Visit (HOSPITAL_COMMUNITY)
Admission: RE | Admit: 2023-07-12 | Discharge: 2023-07-12 | Disposition: A | Discharge status: Home / Self Care | Payer: Medicare HMO | Source: Ambulatory Visit | Attending: Interventional Radiology | Admitting: Interventional Radiology

## 2023-07-12 DIAGNOSIS — E042 Nontoxic multinodular goiter: Secondary | ICD-10-CM

## 2023-07-12 DIAGNOSIS — E041 Nontoxic single thyroid nodule: Secondary | ICD-10-CM | POA: Diagnosis present

## 2023-07-16 LAB — CYTOLOGY - NON PAP

## 2023-07-17 ENCOUNTER — Telehealth: Payer: Self-pay

## 2023-07-17 NOTE — Telephone Encounter (Signed)
-----   Message from [facility] sent at 07/17/2023 12:29 PM EST ----- Please notify patient of cytology reviewed results negative for thyroid cancer and is benign.  Reassuring.

## 2023-07-17 NOTE — Telephone Encounter (Signed)
 Patient given results  as directed by MD. No further questions at this time.

## 2023-08-02 ENCOUNTER — Encounter: Payer: Self-pay | Admitting: Endocrinology

## 2023-08-02 ENCOUNTER — Ambulatory Visit: Payer: Medicare HMO | Admitting: Endocrinology

## 2023-08-02 VITALS — BP 130/80 | HR 79 | Resp 20 | Ht 70.0 in | Wt 263.6 lb

## 2023-08-02 DIAGNOSIS — E049 Nontoxic goiter, unspecified: Secondary | ICD-10-CM

## 2023-08-02 DIAGNOSIS — E042 Nontoxic multinodular goiter: Secondary | ICD-10-CM | POA: Diagnosis not present

## 2023-08-02 DIAGNOSIS — E041 Nontoxic single thyroid nodule: Secondary | ICD-10-CM

## 2023-08-02 NOTE — Progress Notes (Signed)
Outpatient Endocrinology Note [place] Janelly Switalski, MD  08/02/23  Patient's Name: Jackson MCELHONE Sr.    DOB: 1951/03/20    MRN: [id number]  REASON OF VISIT: Follow-up for thyroid nodules   PCP: Marguarite Arbour, MD  REFERRING PROVIDER: Bethanie Dicker, MD   HISTORY OF PRESENT ILLNESS:   Jackson HORITA Sr. is a 73 y.o. old male with past medical history as listed below is presented for follow-up for thyroid nodules /left retrosternal goiter.  Pertinent Thyroid History:  In May 2024 patient had vision issue, had carotid ultrasound at [facility] and found to have thyroid nodules, he subsequently had ultrasound thyroid on Dec 01, 2022, showed multiple left thyroid nodules largest left thyroid nodule measuring 7.8 cm solid, heterogenous, left inferior thyroid nodule measuring 4.5 cm and left mid thyroid nodule measuring 3.6 cm in maximal dimension, there are other smaller left thyroid nodules.  He had right thyroid lobe, status post right hemithyroidectomy in early 2000. Patient reports he had s/p right hemithyroidectomy, at [facility], 2004 for thyroid mass, reportedly not cancer.  No records available to review. 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 -Patient was seen by endocrinology at [facility], [place], at [facility], by Dr. Standley Brooking in June 2024, was planned for biopsy of 2 of larger left thyroid nodules.  Patient reports he had biopsy however sample was not enough and was not able to get the cytology report.  No records available to review. -Initial Endo consultation in June 12, 2023 : plan for FNA of 3 of the left thyroid nodules, inferior left thyroid nodule measuring 7.8 cm solid, heterogenous, left inferior thyroid nodule measuring 4.5 cm and left mid thyroid nodule measuring 3.6 cm in  maximal dimension.   - In July 12, 2023, he underwent needle aspiration biopsy of left inferior thyroid nodule measuring 7.8 cm and left inferior thyroid nodule measuring 4.5 cm : Cytologies were benign.  Clinical History: Left; Inferior7.8cm;  Other 2 dimensions: 6.8 x 7.8cm  solid/almost completely solid isoechoic, TI- RADS - 3  Specimen Submitted:  A. THYROID, LEFT INFERIOR # 1, FINE NEEDLE  ASPIRATION    FINAL MICROSCOPIC DIAGNOSIS:  - Benign follicular nodule (Bethesda category II)   SPECIMEN ADEQUACY:  Satisfactory for evaluation   Clinical History: Left; Inferior #2, 4.5cm;  Other 2 dimensions: 3.9 x  4.3cm solid/almost completely solid isoechoic TI-RADS - 3  Specimen Submitted:  A. THYROID, LEFT INFERIOR # 2, FINE NEEDLE  ASPIRATION:   FINAL MICROSCOPIC DIAGNOSIS:  - Benign follicular nodule (Bethesda category II)   -No family history of thyroid cancer.  She has half sister with thyroid nodule.  -He has remained euthyroid, has never been on thyroid medication despite having right hemithyroidectomy.  - Patient denies h/o significant radiation exposure or radiation treatment in the neck.  - Patient has occasional neck discomfort.  Patient complains of increasing neck discomfort, and pressure effect especially while wearing tie.  He has obstructive sleep apnea on CPAP.  Denies significant neck compressive symptoms in terms of dysphagia or difficulty breathing.  # Patient has type 2 diabetes mellitus, on glimepiride, managed by primary care provider.  Interval history   Patient underwent needle biopsy of left inferior to thyroid nodules, cytology benign.  Patient has not noticed new neck compressive symptoms.  He has occasional leg  discomfort and may be occasional dysphagia.  Has not noticed difficulty breathing.  No lump in the neck.  Biopsy results reviewed.  No other complaints today.  REVIEW OF SYSTEMS:  As per history of present illness.   PAST MEDICAL  HISTORY: Past Medical History:  Diagnosis Date   Actinic keratosis    Anemia    Anxiety    Arthritis    Basal cell carcinoma 11/01/2022   R temple, mohs 03/15/2023   Cancer (HCC)    skin   Coronary artery disease    Diabetes mellitus without complication (HCC)    Dysplastic nevus 12/27/2017   left inf buttock   Dysplastic nevus 08/11/2019   right post shoulder/deltoid   Hypertension    Hypothyroidism    Lower back pain    Pre-diabetes    Prostate enlargement    Sleep apnea    CPAP    PAST SURGICAL HISTORY: Past Surgical History:  Procedure Laterality Date   broken wrist Right    CARDIAC CATHETERIZATION     x 2   CATARACT EXTRACTION W/PHACO Left 09/03/2018   Procedure: CATARACT EXTRACTION PHACO AND INTRAOCULAR LENS PLACEMENT (IOC);  Surgeon: Galen Manila, MD;  Location: [facility] ORS;  Service: Ophthalmology;  Laterality: Left;  [place] 00:26.8 CDE 3.12 Fluid Pack Lot # [id number]   CATARACT EXTRACTION W/PHACO Right 12/03/2018   Procedure: CATARACT EXTRACTION PHACO AND INTRAOCULAR LENS PLACEMENT (IOC)  RIGHT;  Surgeon: Galen Manila, MD;  Location: [facility];  Service: Ophthalmology;  Laterality: Right;   CORONARY ANGIOPLASTY     STENT X 2   CORONARY STENT INTERVENTION N/A 04/15/2019   Procedure: CORONARY STENT INTERVENTION;  Surgeon: Marcina Millard, MD;  Location: [facility] INVASIVE CV LAB;  Service: Cardiovascular;  Laterality: N/A;   HERNIA REPAIR     JOINT REPLACEMENT  05/2018   TKR   KNEE ARTHROPLASTY Left 06/03/2018   Procedure: COMPUTER ASSISTED TOTAL KNEE ARTHROPLASTY;  Surgeon: Donato Heinz, MD;  Location: [facility] ORS;  Service: Orthopedics;  Laterality: Left;   LEFT HEART CATH AND CORONARY ANGIOGRAPHY Left 04/15/2019   Procedure: LEFT HEART CATH AND CORONARY ANGIOGRAPHY;  Surgeon: Marcina Millard, MD;  Location: [facility] INVASIVE CV LAB;  Service: Cardiovascular;  Laterality: Left;   SEPTOPLASTY     SKIN CANCER EXCISION     THYROID SURGERY       ALLERGIES: Allergies  Allergen Reactions   Metoprolol Shortness Of Breath    Hyperactivity    Meperidine Other (See Comments)    "I felt like I was leaving this world." Confusion   Doxycycline Palpitations    Difficulty sleeping, felt like his heart was beating in his stomach   Penicillins Itching, Rash and Other (See Comments)    Did it involve swelling of the face/tongue/throat, SOB, or low BP? No Did it involve sudden or severe rash/hives, skin peeling, or any reaction on the inside of your mouth or nose? No Did you need to seek medical attention at a hospital or doctor's office? No When did it last happen?      30 Years If all above answers are "NO", may proceed with cephalosporin use.      FAMILY HISTORY:  Family History  Problem Relation Age of Onset   Diabetes Mother    Diabetes Brother     SOCIAL HISTORY: Social History   Socioeconomic History   Marital status: Married    Spouse name: June    Number of children: 4   Years of education: Not on  file   Highest education level: Not on file  Occupational History   Occupation: preacher   Tobacco Use   Smoking status: Former    Current packs/day: 0.00    Average packs/day: 2.0 packs/day for 10.0 years (20.0 ttl pk-yrs)    Types: Cigarettes    Start date: 05/22/1969    Quit date: 05/23/1979    Years since quitting: 44.2   Smokeless tobacco: Never  Vaping Use   Vaping status: Never Used  Substance and Sexual Activity   Alcohol use: Never   Drug use: Never   Sexual activity: Yes    Birth control/protection: None  Other Topics Concern   Not on file  Social History Narrative   Not on file   Social Drivers of Health   Financial Resource Strain: Low Risk  (04/15/2019)   Overall Financial Resource Strain (CARDIA)    Difficulty of Paying Living Expenses: Not very hard  Food Insecurity: No Food Insecurity (04/15/2019)   Hunger Vital Sign    Worried About Running Out of Food in the Last Year: Never true     Ran Out of Food in the Last Year: Never true  Transportation Needs: No Transportation Needs (04/15/2019)   PRAPARE - Administrator, Civil Service (Medical): No    Lack of Transportation (Non-Medical): No  Physical Activity: Unknown (04/15/2019)   Exercise Vital Sign    Days of Exercise per Week: 6 days    Minutes of Exercise per Session: Not on file  Stress: No Stress Concern Present (04/15/2019)   [employer] of Occupational Health - Occupational Stress Questionnaire    Feeling of Stress : Only a little  Social Connections: Unknown (04/15/2019)   Social Connection and Isolation Panel [NHANES]    Frequency of Communication with Friends and Family: More than three times a week    Frequency of Social Gatherings with Friends and Family: Not on file    Attends Religious Services: Not on file    Active Member of Clubs or Organizations: Not on file    Attends Banker Meetings: Not on file    Marital Status: Not on file    MEDICATIONS:  Current Outpatient Medications  Medication Sig Dispense Refill   acetaminophen (TYLENOL) 500 MG tablet Take 500 mg by mouth daily as needed for moderate pain (pain score 4-6) (for pain.).     ALPRAZolam (XANAX) 0.5 MG tablet Take 0.5 mg by mouth at bedtime as needed for anxiety or sleep.      aspirin EC 81 MG tablet Take 81 mg by mouth daily.     azelastine (ASTELIN) 0.1 % nasal spray Place into the nose.     calcipotriene (DOVONOX) 0.005 % cream Apply topically 2 (two) times daily. 60 g 0   carvedilol (COREG) 6.25 MG tablet Take 6.25 mg by mouth 2 (two) times daily.     diltiazem (CARDIZEM CD) 240 MG 24 hr capsule Take 240 mg by mouth daily.     fluorouracil (EFUDEX) 5 % cream Apply topically 2 (two) times daily. 40 g 0   gabapentin (NEURONTIN) 400 MG capsule Take 400 mg by mouth at bedtime.     Ibuprofen-Acetaminophen (ADVIL DUAL ACTION) 125-250 MG TABS Take 2 tablets by mouth in the morning and at bedtime.     ketoconazole  (NIZORAL) 2 % cream Apply 1 application. topically 2 (two) times daily as needed.     loperamide (IMODIUM) 2 MG capsule Take 2 mg by mouth as needed.  losartan (COZAAR) 25 MG tablet Take 25 mg by mouth daily.     Multiple Vitamins-Minerals (MULTIVITAMIN WITH MINERALS) tablet Take 1 tablet by mouth daily.      rosuvastatin (CRESTOR) 5 MG tablet Take 1 tablet by mouth daily.     tamsulosin (FLOMAX) 0.4 MG CAPS capsule Take by mouth.     triamcinolone (NASACORT) 55 MCG/ACT AERO nasal inhaler Place 2 sprays into the nose daily.     glimepiride (AMARYL) 2 MG tablet Take 0.5-1 tablets by mouth daily. (Patient not taking: Reported on 08/02/2023)     No current facility-administered medications for this visit.    PHYSICAL EXAM: Vitals:   08/02/23 1610  BP: 130/80  Pulse: 79  Resp: 20  SpO2: 94%  Weight: 263 lb 9.6 oz (119.6 kg)  Height: 5\' 10"  (1.778 m)    Body mass index is 37.82 kg/m.  Wt Readings from Last 3 Encounters:  08/02/23 263 lb 9.6 oz (119.6 kg)  06/12/23 261 lb (118.4 kg)  03/02/22 262 lb 11.2 oz (119.2 kg)     General: Well developed, well nourished male in no apparent distress.  HEENT: AT/[place], no external lesions. Hearing intact to the spoken word Eyes: EOMI. No stare, proptosis. Conjunctiva clear and no icterus. Neck: Trachea rightward shift, neck supple without appreciable thyromegaly or lymphadenopathy and left palpable thyroid nodule +, anterior neck is scar present from thyroidectomy. Lungs: Clear to auscultation, no wheeze. Respirations not labored Heart: S1S2, Regular in rate and rhythm.  Abdomen: Soft, non tender, non distended Neurologic: Alert, oriented, normal speech, deep tendon biceps reflexes normal,  no gross focal neurological deficit Extremities: No pedal pitting edema, no tremors of outstretched hands Skin: Warm, color good.  Psychiatric: Does not appear depressed or anxious  PERTINENT HISTORIC LABORATORY AND IMAGING STUDIES:  All pertinent  laboratory results were reviewed. Please see HPI also for further details.   TSH  Date Value Ref Range Status  06/12/2023 0.50 0.40 - 4.50 mIU/L Final  06/04/2018 0.892 0.350 - 4.500 uIU/mL Final    Comment:    Performed by a 3rd Generation assay with a functional sensitivity of <=0.01 uIU/mL. Performed at [facility], [address] [place], [place] [address]    Ultrasound thyroid: Dec 01, 2022 reviewed images.  CLINICAL DATA:  Nodule.  Previous hemithyroidectomy 2004.   EXAM: THYROID ULTRASOUND   TECHNIQUE: Ultrasound examination of the thyroid gland and adjacent soft tissues was performed.   COMPARISON:  None Available.   FINDINGS: Parenchymal Echotexture: Markedly heterogenous   Isthmus: 0.5 cm thickness   Right lobe: Surgically absent   Left lobe: 10 x 2.6 x 2.8 cm   _________________________________________________________   Estimated total number of nodules >/= 1 cm: 5   Number of spongiform nodules >/=  2 cm not described below (TR1): 0   Number of mixed cystic and solid nodules >/= 1.5 cm not described below (TR2): 0   _________________________________________________________   Nodule # 1:   Location: Left; inferior   Maximum size: 7.8 cm; Other 2 dimensions: 6.8 x 7.8 cm   Composition: solid/almost completely solid (2)   Echogenicity: isoechoic (1)   Shape: not taller-than-wide (0)   Margins: ill-defined (0)   Echogenic foci: none (0)   ACR TI-RADS total points: 3.   ACR TI-RADS risk category: TR 3.   ACR TI-RADS recommendations:   **Given size (>/= 2.5 cm) and appearance, fine needle aspiration of this mildly suspicious nodule should be considered based on TI-RADS criteria.  _________________________________________________________   Nodule # 2:   Location: Left; inferior   Maximum size: 4.5 cm; Other 2 dimensions: 3.9 x 4.3 cm   Composition: solid/almost completely solid (2)   Echogenicity: isoechoic (1)    Shape: not taller-than-wide (0)   Margins: ill-defined (0)   Echogenic foci: none (0)   ACR TI-RADS total points: 3.   ACR TI-RADS risk category: TR 3.   ACR TI-RADS recommendations:   **Given size (>/= 2.5 cm) and appearance, fine needle aspiration of this mildly suspicious nodule should be considered based on TI-RADS criteria.   _________________________________________________________   Nodule # 3:   Location: Left; medial   Maximum size: 1.7 cm; Other 2 dimensions: 1.2 x 1.3 cm   Composition: solid/almost completely solid (2)   Echogenicity: isoechoic (1)   Shape: not taller-than-wide (0)   Margins: ill-defined (0)   Echogenic foci: none (0)   ACR TI-RADS total points: 3.   ACR TI-RADS risk category: TR 3.   ACR TI-RADS recommendations:   *Given size (>/= 1.5 - 2.4 cm) and appearance, a follow-up ultrasound in 1 year should be considered based on TI-RADS criteria.   _________________________________________________________   Nodule # 4:   Location: Left; mid   Maximum size: 3.6 cm; Other 2 dimensions: 3.4 x 3.4 cm   Composition: solid/almost completely solid (2)   Echogenicity: isoechoic (1)   Shape: not taller-than-wide (0)   Margins: ill-defined (0)   Echogenic foci: none (0)   ACR TI-RADS total points: 3.   ACR TI-RADS risk category: TR 3.   ACR TI-RADS recommendations:   **Given size (>/= 2.5 cm) and appearance, fine needle aspiration of this mildly suspicious nodule should be considered based on TI-RADS criteria.   _________________________________________________________   Nodule # 5:   Location: Left; superior   Maximum size: 2.1 cm; Other 2 dimensions: 1.1 x 1.9 cm   Composition: solid/almost completely solid (2)   Echogenicity: isoechoic (1)   Shape: not taller-than-wide (0)   Margins: ill-defined (0)   Echogenic foci: none (0)   ACR TI-RADS total points: 3.   ACR TI-RADS risk category: TR 3.   ACR TI-RADS  recommendations:   *Given size (>/= 1.5 - 2.4 cm) and appearance, a follow-up ultrasound in 1 year should be considered based on TI-RADS criteria.   _________________________________________________________   No regional cervical adenopathy identified.   IMPRESSION: 1. No residual/recurrent tissue post right hemithyroidectomy. 2. Thyromegaly with multiple left mildly suspicious nodules. In the absence of previous studies demonstrating stability, current guidelines recommend biopsy of the 2 largest lesions. 3. Recommend annual/biennial ultrasound follow-up of additional nodules as above, until stability x5 years confirmed.    ASSESSMENT / PLAN  1. Multiple thyroid nodules   2. Retrosternal thyroid goiter   3. Left thyroid nodule    -Patient is known to have thyroid goiter, status post right hemithyroidectomy in August 2004, with benign pathology reportedly.  On imaging test she was found to have multiple left thyroid nodules, last ultrasound was in May 2024.  With the available imaging studies, CT chest from 2019-2024 reviewed, left enlarged thyroid lobe, with retrosternal extension into mediastinum and rightward septal trachea. -He is euthyroid.  He has some neck discomfort, denies significant neck compressive symptoms. No family history of thyroid cancer.  No radiation exposure. -He underwent FNA of two left inferior thyroid nodules measuring 7.8 and 4.5 cm in December 2024 with benign cytology.  He has left mid thyroid nodule measuring 3.6 cm has not been  biopsied yet.  Plan: -Discussed about needle biopsy of third left thyroid nodule measuring 3.6 cm vs discussed about monitoring with repeat ultrasound and if size or character changes then consider for biopsy.  Patient prefers to monitor with ultrasound and if anything changes may consider for biopsy of this thyroid nodule.  It is reasonable. -Discussed that thyroid surgery can be considered. It depends upon neck compressive symptoms  as well.  We discussed that it is reasonable to watch clinically and with imaging as well.  He does not have significant neck compressive symptoms at this time.  Discussed about referral to ENT/thyroid surgeon, to explore surgical option and pros and cons as well.  We discussed and make a plan that we will repeat ultrasound in few months if anything changes or if he develops worsening neck related symptoms we will consider thyroid surgery. -Will plan for repeat ultrasound thyroid in 3 to 4 months. -Follow-up in 4 months after thyroid ultrasound. -Patient is asked to call our clinic with any new neck related symptoms.  Diagnoses and all orders for this visit:  Multiple thyroid nodules -     US THYROID; Future  Retrosternal thyroid goiter  Left thyroid nodule     DISPOSITION Follow up in clinic in 4 months suggested.  All questions answered and patient verbalized understanding of the plan.  [place] Aliviana Burdell, MD [facility] Endocrinology [facility] [address] [place], [address] [place], [place] [address] Phone # [phone]  At least part of this note was generated using voice recognition software. Inadvertent word errors may have occurred, which were not recognized during the proofreading process.

## 2023-08-02 NOTE — Patient Instructions (Signed)
Plan for US thyroid in April / May and follow up with me after.

## 2023-10-30 ENCOUNTER — Inpatient Hospital Stay: Admission: RE | Admit: 2023-10-30 | Payer: Medicare HMO | Source: Ambulatory Visit

## 2023-10-31 ENCOUNTER — Ambulatory Visit
Admission: RE | Admit: 2023-10-31 | Discharge: 2023-10-31 | Disposition: A | Discharge status: Home / Self Care | Source: Ambulatory Visit | Attending: Endocrinology | Admitting: Endocrinology

## 2023-10-31 DIAGNOSIS — E042 Nontoxic multinodular goiter: Secondary | ICD-10-CM

## 2023-11-05 ENCOUNTER — Encounter: Payer: Self-pay | Admitting: Endocrinology

## 2023-11-21 ENCOUNTER — Ambulatory Visit: Payer: Medicare HMO | Admitting: Dermatology

## 2023-12-03 ENCOUNTER — Ambulatory Visit: Payer: Medicare HMO | Admitting: Endocrinology

## 2023-12-03 ENCOUNTER — Encounter: Payer: Self-pay | Admitting: Endocrinology

## 2023-12-03 VITALS — BP 128/70 | HR 77 | Resp 20 | Ht 70.0 in | Wt 259.4 lb

## 2023-12-03 DIAGNOSIS — E041 Nontoxic single thyroid nodule: Secondary | ICD-10-CM

## 2023-12-03 DIAGNOSIS — E042 Nontoxic multinodular goiter: Secondary | ICD-10-CM | POA: Diagnosis not present

## 2023-12-03 DIAGNOSIS — E049 Nontoxic goiter, unspecified: Secondary | ICD-10-CM | POA: Diagnosis not present

## 2023-12-03 NOTE — Progress Notes (Signed)
 Outpatient Endocrinology Note Jackson Cheskel Silverio, MD  12/04/23  Patient's Name: Jackson ARAVE Sr.    DOB: Sep 20, 1950    MRN: [id number]  REASON OF VISIT: Follow-up for thyroid  nodules   PCP: Yehuda Helms, MD  REFERRING PROVIDER: Wilhelmenia Harada, MD   HISTORY OF PRESENT ILLNESS:   Jackson AINSLEY Sr. is a 73 y.o. old male with past medical history as listed below is presented for follow-up for thyroid  nodules / left retrosternal goiter.  Pertinent Thyroid  History:  In May 2024 patient had vision issue, had carotid ultrasound at [facility] and found to have thyroid  nodules, he subsequently had ultrasound thyroid  on Dec 01, 2022, showed multiple left thyroid  nodules largest left thyroid  nodule measuring 7.8 cm solid, heterogenous, left inferior thyroid  nodule measuring 4.5 cm and left mid thyroid  nodule measuring 3.6 cm in maximal dimension, there are other smaller left thyroid  nodules.  He had right thyroid  lobe, status post right hemithyroidectomy in early 2000. Patient reports he had s/p right hemithyroidectomy, at [facility], 2004 for thyroid  mass, reportedly not cancer.  No records available to review. 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 -Patient was seen by endocrinology at [facility], [place], at [facility], by Dr. Monta Anton in June 2024, was planned for biopsy of 2 of larger left thyroid  nodules.  Patient reports he had biopsy however sample was not enough and was not able to get the cytology report.  No records available to review. -Initial Endo consultation in June 12, 2023 : plan for FNA of 3 of the left thyroid  nodules, inferior left thyroid  nodule measuring 7.8 cm solid, heterogenous, left inferior thyroid  nodule measuring 4.5 cm and left mid thyroid  nodule measuring 3.6 cm in  maximal dimension.   - In July 12, 2023, he underwent needle aspiration biopsy of left inferior thyroid  nodule measuring 7.8 cm and left inferior thyroid  nodule measuring 4.5 cm : Cytologies were benign.  Clinical History: Left; Inferior 7.8cm;  Other 2 dimensions: 6.8 x 7.8cm  solid/almost completely solid isoechoic, TI- RADS - 3  Specimen Submitted:  A. THYROID , LEFT INFERIOR # 1, FINE NEEDLE  ASPIRATION    FINAL MICROSCOPIC DIAGNOSIS:  - Benign follicular nodule (Bethesda category II)   SPECIMEN ADEQUACY:  Satisfactory for evaluation   Clinical History: Left; Inferior #2, 4.5cm;  Other 2 dimensions: 3.9 x  4.3cm solid/almost completely solid isoechoic TI-RADS - 3  Specimen Submitted:  A. THYROID , LEFT INFERIOR # 2, FINE NEEDLE  ASPIRATION:   FINAL MICROSCOPIC DIAGNOSIS:  - Benign follicular nodule (Bethesda category II)   -No family history of thyroid  cancer.  She has half sister with thyroid  nodule.  -He has remained euthyroid, has never been on thyroid  medication despite having right hemithyroidectomy.  - Patient denies h/o significant radiation exposure or radiation treatment in the neck.  - Patient has occasional neck discomfort.  Patient complains of increasing neck discomfort, and pressure effect especially while wearing tie.  He has obstructive sleep apnea on CPAP.  Denies significant neck compressive symptoms in terms of dysphagia or difficulty breathing.  # Patient has type 2 diabetes mellitus, on glimepiride, managed by primary care provider.  Interval history  Patient had ultrasound thyroid  in last month relatively stable left thyroid  nodule.  Patient reports mildly increased dysphagia occasionally not with all types of food.  Denies any difficulty breathing in any position.  He has obstructive sleep apnea on CPAP. No choking and no change in voice.  Overall feeling normal energy.  No hypo and hyperthyroid symptoms.  No other complaints today.  IMPRESSION: US   thyroid  10/31/2023: reviewed images and compared.  1. Surgical changes of prior right hemithyroidectomy. 2. Similar appearance of diffusely enlarged and goitrous left thyroid  gland. 3. The previously biopsied nodule # 1 in the left lower gland is slightly smaller in size. Involution over time is consistent with benignity. No further follow-up recommended. 4. The other previously biopsied nodule in the left lower gland is no longer confidently identified and appears to have blended in with the background goitrous change. 5. No new nodules or suspicious features.  REVIEW OF SYSTEMS:  As per history of present illness.   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   Cancer (HCC)    skin   Coronary artery disease    Diabetes mellitus without complication (HCC)    Dysplastic nevus 12/27/2017   left inf buttock   Dysplastic nevus 08/11/2019   right post shoulder/deltoid   Hypertension    Hypothyroidism    Lower back pain    Pre-diabetes    Prostate enlargement    Sleep apnea    CPAP    PAST SURGICAL HISTORY: Past Surgical History:  Procedure Laterality Date   broken wrist Right    CARDIAC CATHETERIZATION     x 2   CATARACT EXTRACTION W/PHACO Left 09/03/2018   Procedure: CATARACT EXTRACTION PHACO AND INTRAOCULAR LENS PLACEMENT (IOC);  Surgeon: Clair Crews, MD;  Location: [facility] OR[facility];  Service: Ophthalmology;  Laterality: Left;  US  00:26.8 CDE 3.12 Fluid Pack Lot # [id number]   CATARACT EXTRACTION W/PHACO Right 12/03/2018   Procedure: CATARACT EXTRACTION PHACO AND INTRAOCULAR LENS PLACEMENT (IOC)  RIGHT;  Surgeon: Clair Crews, MD;  Location: [facility];  Service: Ophthalmology;  Laterality: Right;   CORONARY ANGIOPLASTY     STENT X 2   CORONARY STENT INTERVENTION N/A 04/15/2019   Procedure: CORONARY STENT INTERVENTION;   Surgeon: Percival Brace, MD;  Location: [facility] IN[facility]IVE [facility];  Service: Cardiovascular;  Laterality: N/A;   HERNIA REPAIR     JOINT REPLACEMENT  05/2018   TKR   KNEE ARTHROPLASTY Left 06/03/2018   Procedure: COMPUTER ASSISTED TOTAL KNEE ARTHROPLASTY;  Surgeon: Arlyne Lame, MD;  Location: [facility] OR[facility];  Service: Orthopedics;  Laterality: Left;   LEFT HEART CATH AND CORONARY ANGIOGRAPHY Left 04/15/2019   Procedure: LEFT HEART CATH AND CORONARY ANGIOGRAPHY;  Surgeon: Percival Brace, MD;  Location: [facility];  Service: Cardiovascular;  Laterality: Left;   SEPTOPLASTY     SKIN CANCER EXCISION     THYROID  SURGERY      ALLERGIES: Allergies  Allergen Reactions   Metoprolol  Shortness Of Breath    Hyperactivity    Demerol [Meperidine Hcl] Other (See Comments)    Patient gets extremely hyperactive per patient   Meperidine Other (See Comments)    "I felt like I was leaving this world." Confusion   Doxycycline Palpitations    Difficulty sleeping, felt like his heart was beating in his stomach   Penicillins Itching, Rash and Other (See Comments)    Did it involve swelling of the face/tongue/throat, SOB, or low BP? No Did it  involve sudden or severe rash/hives, skin peeling, or any reaction on the inside of your mouth or nose? No Did you need to seek medical attention at a hospital or doctor's office? No When did it last happen?      30 Years If all above answers are "NO", may proceed with cephalosporin use.      FAMILY HISTORY:  Family History  Problem Relation Age of Onset   Diabetes Mother    Diabetes Brother     SOCIAL HISTORY: Social History   Socioeconomic History   Marital status: Married    Spouse name: June    Number of children: 4   Years of education: Not on file   Highest education level: Not on file  Occupational History   Occupation: Programmer, multimedia   Tobacco Use   Smoking status: Former    Current packs/day: 0.00    Average packs/day: 2.0  packs/day for 10.0 years (20.0 ttl pk-yrs)    Types: Cigarettes    Start date: 05/22/1969    Quit date: 05/23/1979    Years since quitting: 44.5   Smokeless tobacco: Never  Vaping Use   Vaping status: Never Used  Substance and Sexual Activity   Alcohol  use: Never   Drug use: Never   Sexual activity: Yes    Birth control/protection: None  Other Topics Concern   Not on file  Social History Narrative   Not on file   Social Drivers of Health   Financial Resource Strain: Low Risk  (10/22/2023)   Received from [facility]   Overall Financial Resource Strain (CARDIA)    Difficulty of Paying Living Expenses: Not hard at all  Recent Concern: Financial Resource Strain - Medium Risk (08/09/2023)   Received from [facility]   Overall Financial Resource Strain (CARDIA)    Difficulty of Paying Living Expenses: Somewhat hard  Food Insecurity: No Food Insecurity (10/22/2023)   Received from [facility]   Hunger Vital Sign    Worried About Running Out of Food in the Last Year: Never true    Ran Out of Food in the Last Year: Never true  Recent Concern: Food Insecurity - Food Insecurity Present (08/09/2023)   Received from [facility]   Hunger Vital Sign    Worried About Running Out of Food in the Last Year: Sometimes true    Ran Out of Food in the Last Year: Sometimes true  Transportation Needs: No Transportation Needs (10/22/2023)   Received from [facility] - Transportation    In the past 12 months, has lack of transportation kept you from medical appointments or from getting medications?: No    Lack of Transportation (Non-Medical): No  Physical Activity: Unknown (04/15/2019)   Exercise Vital Sign    Days of Exercise per Week: 6 days    Minutes of Exercise per Session: Not on file  Stress: No Stress Concern Present (04/15/2019)   [employer] of Occupational Health - Occupational Stress  Questionnaire    Feeling of Stress : Only a little  Social Connections: Unknown (04/15/2019)   Social Connection and Isolation Panel [NHANES]    Frequency of Communication with Friends and Family: More than three times a week    Frequency of Social Gatherings with Friends and Family: Not on file    Attends Religious Services: Not on file    Active Member of Clubs or Organizations: Not on file    Attends Club  or Organization Meetings: Not on file    Marital Status: Not on file    MEDICATIONS:  Current Outpatient Medications  Medication Sig Dispense Refill   acetaminophen  (TYLENOL ) 500 MG tablet Take 500 mg by mouth daily as needed for moderate pain (pain score 4-6) (for pain.).     ALPRAZolam  (XANAX ) 0.5 MG tablet Take 0.5 mg by mouth at bedtime as needed for anxiety or sleep.      aspirin  EC 81 MG tablet Take 81 mg by mouth daily.     azelastine (ASTELIN) 0.1 % nasal spray Place into the nose.     calcipotriene  (DOVONOX) 0.005 % cream Apply topically 2 (two) times daily. 60 g 0   carvedilol  (COREG ) 6.25 MG tablet Take 6.25 mg by mouth 2 (two) times daily.     diltiazem  (CARDIZEM  CD) 240 MG 24 hr capsule Take 240 mg by mouth daily.     fluorouracil  (EFUDEX ) 5 % cream Apply topically 2 (two) times daily. 40 g 0   gabapentin  (NEURONTIN ) 400 MG capsule Take 400 mg by mouth at bedtime.     glimepiride (AMARYL) 2 MG tablet Take 0.5-1 tablets by mouth daily.     Ibuprofen-Acetaminophen  (ADVIL DUAL ACTION) 125-250 MG TABS Take 2 tablets by mouth in the morning and at bedtime.     ketoconazole  (NIZORAL ) 2 % cream Apply 1 application. topically 2 (two) times daily as needed.     losartan  (COZAAR ) 25 MG tablet Take 25 mg by mouth daily.     Multiple Vitamins-Minerals (MULTIVITAMIN WITH MINERALS) tablet Take 1 tablet by mouth daily.      rosuvastatin (CRESTOR) 5 MG tablet Take 1 tablet by mouth daily.     tamsulosin  (FLOMAX ) 0.4 MG CAPS capsule Take by mouth.     triamcinolone  (NASACORT ) 55 MCG/ACT  AERO nasal inhaler Place 2 sprays into the nose daily.     No current facility-administered medications for this visit.    PHYSICAL EXAM: Vitals:   12/03/23 1317  BP: 128/70  Pulse: 77  Resp: 20  SpO2: 94%  Weight: 259 lb 6.4 oz (117.7 kg)  Height: 5\' 10"  (1.778 m)     Body mass index is 37.22 kg/m.  Wt Readings from Last 3 Encounters:  12/03/23 259 lb 6.4 oz (117.7 kg)  08/02/23 263 lb 9.6 oz (119.6 kg)  06/12/23 261 lb (118.4 kg)     General: Well developed, well nourished male in no apparent distress.  HEENT: AT/Brush, no external lesions. Hearing intact to the spoken word Eyes: EOMI. No stare, proptosis. Conjunctiva clear and no icterus. Neck: Trachea rightward shift, neck supple without appreciable thyromegaly or lymphadenopathy and left palpable thyroid  nodule +, anterior neck is scar present from thyroidectomy. Lungs: Clear to auscultation, no wheeze. Respirations not labored Heart: S1S2, Regular in rate and rhythm.  Abdomen: Soft, non tender, non distended Neurologic: Alert, oriented, normal speech, deep tendon biceps reflexes normal,  no gross focal neurological deficit Extremities: No pedal pitting edema, no tremors of outstretched hands Skin: Warm, color good.  Psychiatric: Does not appear depressed or anxious  PERTINENT HISTORIC LABORATORY AND IMAGING STUDIES:  All pertinent laboratory results were reviewed. Please see HPI also for further details.   TSH  Date Value Ref Range Status  06/12/2023 0.50 0.40 - 4.50 mIU/L Final  06/04/2018 0.892 0.350 - 4.500 uIU/mL Final    Comment:    Performed by a 3rd Generation assay with a functional sensitivity of <=0.01 uIU/mL. Performed at [facility], [phone]  [address] [place], [place] [address]    Ultrasound thyroid : Dec 01, 2022 reviewed images.  CLINICAL DATA:  Nodule.  Previous hemithyroidectomy 2004.   EXAM: THYROID  ULTRASOUND   TECHNIQUE: Ultrasound examination of the thyroid  gland and adjacent  soft tissues was performed.   COMPARISON:  None Available.   FINDINGS: Parenchymal Echotexture: Markedly heterogenous   Isthmus: 0.5 cm thickness   Right lobe: Surgically absent   Left lobe: 10 x 2.6 x 2.8 cm   _________________________________________________________   Estimated total number of nodules >/= 1 cm: 5   Number of spongiform nodules >/=  2 cm not described below (TR1): 0   Number of mixed cystic and solid nodules >/= 1.5 cm not described below (TR2): 0   _________________________________________________________   Nodule # 1:   Location: Left; inferior   Maximum size: 7.8 cm; Other 2 dimensions: 6.8 x 7.8 cm   Composition: solid/almost completely solid (2)   Echogenicity: isoechoic (1)   Shape: not taller-than-wide (0)   Margins: ill-defined (0)   Echogenic foci: none (0)   ACR TI-RADS total points: 3.   ACR TI-RADS risk category: TR 3.   ACR TI-RADS recommendations:   **Given size (>/= 2.5 cm) and appearance, fine needle aspiration of this mildly suspicious nodule should be considered based on TI-RADS criteria.   _________________________________________________________   Nodule # 2:   Location: Left; inferior   Maximum size: 4.5 cm; Other 2 dimensions: 3.9 x 4.3 cm   Composition: solid/almost completely solid (2)   Echogenicity: isoechoic (1)   Shape: not taller-than-wide (0)   Margins: ill-defined (0)   Echogenic foci: none (0)   ACR TI-RADS total points: 3.   ACR TI-RADS risk category: TR 3.   ACR TI-RADS recommendations:   **Given size (>/= 2.5 cm) and appearance, fine needle aspiration of this mildly suspicious nodule should be considered based on TI-RADS criteria.   _________________________________________________________   Nodule # 3:   Location: Left; medial   Maximum size: 1.7 cm; Other 2 dimensions: 1.2 x 1.3 cm   Composition: solid/almost completely solid (2)   Echogenicity: isoechoic (1)   Shape: not  taller-than-wide (0)   Margins: ill-defined (0)   Echogenic foci: none (0)   ACR TI-RADS total points: 3.   ACR TI-RADS risk category: TR 3.   ACR TI-RADS recommendations:   *Given size (>/= 1.5 - 2.4 cm) and appearance, a follow-up ultrasound in 1 year should be considered based on TI-RADS criteria.   _________________________________________________________   Nodule # 4:   Location: Left; mid   Maximum size: 3.6 cm; Other 2 dimensions: 3.4 x 3.4 cm   Composition: solid/almost completely solid (2)   Echogenicity: isoechoic (1)   Shape: not taller-than-wide (0)   Margins: ill-defined (0)   Echogenic foci: none (0)   ACR TI-RADS total points: 3.   ACR TI-RADS risk category: TR 3.   ACR TI-RADS recommendations:   **Given size (>/= 2.5 cm) and appearance, fine needle aspiration of this mildly suspicious nodule should be considered based on TI-RADS criteria.   _________________________________________________________   Nodule # 5:   Location: Left; superior   Maximum size: 2.1 cm; Other 2 dimensions: 1.1 x 1.9 cm   Composition: solid/almost completely solid (2)   Echogenicity: isoechoic (1)   Shape: not taller-than-wide (0)   Margins: ill-defined (0)   Echogenic foci: none (0)   ACR TI-RADS total points: 3.   ACR TI-RADS risk category: TR 3.   ACR TI-RADS recommendations:   *  Given size (>/= 1.5 - 2.4 cm) and appearance, a follow-up ultrasound in 1 year should be considered based on TI-RADS criteria.   _________________________________________________________   No regional cervical adenopathy identified.   IMPRESSION: 1. No residual/recurrent tissue post right hemithyroidectomy. 2. Thyromegaly with multiple left mildly suspicious nodules. In the absence of previous studies demonstrating stability, current guidelines recommend biopsy of the 2 largest lesions. 3. Recommend annual/biennial ultrasound follow-up of additional nodules as above,  until stability x5 years confirmed.    ASSESSMENT / PLAN  1. Multiple thyroid  nodules   2. Retrosternal thyroid  goiter   3. Left thyroid  nodule    -Patient is known to have thyroid  goiter, status post right hemithyroidectomy in August 2004, with benign pathology reportedly.  On imaging test she was found to have multiple left thyroid  nodules, last ultrasound was in May 2024.  With the available imaging studies, CT chest from 2019-2024 reviewed, left enlarged thyroid  lobe, with retrosternal extension into mediastinum and rightward shift of trachea. -He is euthyroid.  He has some neck discomfort and occasional dysphagia, no other significant neck compressive symptoms. No family history of thyroid  cancer.  No radiation exposure. -He underwent FNA of two left inferior thyroid  nodules measuring 7.8 and 4.5 cm in December 2024 with benign cytology.  He has left mid thyroid  nodule measuring 3.6 cm has not been biopsied yet. - Ultrasound thyroid  in April 2025 with relatively stable left thyroid  nodule. - He has complaints of mildly increasing frequency of intermittent dysphagia.  Plan: -No plan for additional FNA/biopsy of thyroid  nodule at this time.  He has mildly increasing intermittent dysphagia, discussed that thyroid  surgery can be considered. It depends upon neck compressive symptoms as well.  We discussed that it is reasonable to watch clinically and with imaging as well.  He does not have significant neck compressive symptoms at this time, is not bothering him much.  Discussed about referral to ENT/thyroid  surgeon, to explore surgical option and pros and cons as well.  We discussed and make a plan that we will monitor his neck compressive symptoms for next 6 months.  If his neck compressive symptoms/intermittent dysphagia presently increases will refer to ENT.   - Consider CT neck/chest to monitor retrosternal goiter. -Patient is asked to call our clinic with any new or worsening neck related  symptoms.  Diagnoses and all orders for this visit:  Multiple thyroid  nodules  Retrosternal thyroid  goiter  Left thyroid  nodule    DISPOSITION Follow up in clinic in 6 months suggested.  All questions answered and patient verbalized understanding of the plan.  Jackson Ovila Lepage, MD [facility] Endocrinology [facility] [address] [place], [address] [place], [place] [address] Phone # [phone]  At least part of this note was generated using voice recognition software. Inadvertent word errors may have occurred, which were not recognized during the proofreading process.

## 2023-12-04 ENCOUNTER — Encounter: Payer: Self-pay | Admitting: Endocrinology

## 2023-12-24 ENCOUNTER — Ambulatory Visit: Admitting: Dermatology

## 2023-12-24 ENCOUNTER — Encounter: Payer: Self-pay | Admitting: Dermatology

## 2023-12-24 DIAGNOSIS — W908XXA Exposure to other nonionizing radiation, initial encounter: Secondary | ICD-10-CM

## 2023-12-24 DIAGNOSIS — L578 Other skin changes due to chronic exposure to nonionizing radiation: Secondary | ICD-10-CM | POA: Diagnosis not present

## 2023-12-24 DIAGNOSIS — Z85828 Personal history of other malignant neoplasm of skin: Secondary | ICD-10-CM

## 2023-12-24 DIAGNOSIS — D1801 Hemangioma of skin and subcutaneous tissue: Secondary | ICD-10-CM

## 2023-12-24 DIAGNOSIS — L814 Other melanin hyperpigmentation: Secondary | ICD-10-CM

## 2023-12-24 DIAGNOSIS — L820 Inflamed seborrheic keratosis: Secondary | ICD-10-CM | POA: Diagnosis not present

## 2023-12-24 DIAGNOSIS — Z1283 Encounter for screening for malignant neoplasm of skin: Secondary | ICD-10-CM

## 2023-12-24 DIAGNOSIS — D492 Neoplasm of unspecified behavior of bone, soft tissue, and skin: Secondary | ICD-10-CM | POA: Diagnosis not present

## 2023-12-24 DIAGNOSIS — D225 Melanocytic nevi of trunk: Secondary | ICD-10-CM

## 2023-12-24 DIAGNOSIS — L918 Other hypertrophic disorders of the skin: Secondary | ICD-10-CM

## 2023-12-24 DIAGNOSIS — D489 Neoplasm of uncertain behavior, unspecified: Secondary | ICD-10-CM

## 2023-12-24 DIAGNOSIS — D229 Melanocytic nevi, unspecified: Secondary | ICD-10-CM

## 2023-12-24 DIAGNOSIS — Z85820 Personal history of malignant melanoma of skin: Secondary | ICD-10-CM

## 2023-12-24 DIAGNOSIS — L821 Other seborrheic keratosis: Secondary | ICD-10-CM

## 2023-12-24 DIAGNOSIS — Z808 Family history of malignant neoplasm of other organs or systems: Secondary | ICD-10-CM

## 2023-12-24 DIAGNOSIS — Z86018 Personal history of other benign neoplasm: Secondary | ICD-10-CM

## 2023-12-24 NOTE — Progress Notes (Signed)
 Follow-Up Visit   Subjective  Jackson Jordan Sr. is a 73 y.o. male who presents for the following: Skin Cancer Screening and Full Body Skin Exam, hx of melanoma  Hx of bcc, hx of dysplastic nevi, hx of aks and isk, hx of  Spot at left elbow that bothers him   Hx of dysplastic nevi, hx of ak and isk  Hx of bcc, hx of melanoma reported by patient.  Reports hx of melanoma at right posterior shoulder, left posterior leg, back and right face.  Treated in 1990 to 1991 by Dr. Carmin Chow, [facility] in [place]  The patient presents for Total-Body Skin Exam (TBSE) for skin cancer screening and mole check. The patient has spots, moles and lesions to be evaluated, some may be new or changing and the patient may have concern these could be cancer.  The following portions of the chart were reviewed this encounter and updated as appropriate: medications, allergies, medical history  Review of Systems:  No other skin or systemic complaints except as noted in HPI or Assessment and Plan.  Objective  Well appearing patient in no apparent distress; mood and affect are within normal limits.  A full examination was performed including scalp, head, eyes, ears, nose, lips, neck, chest, axillae, abdomen, back, buttocks, bilateral upper extremities, bilateral lower extremities, hands, feet, fingers, toes, fingernails, and toenails. All findings within normal limits unless otherwise noted below.   Relevant physical exam findings are noted in the Assessment and Plan.  left elbow x 1, right shoulder x 20 (21) Erythematous stuck-on, waxy papule or plaque right abdomen lateral to superior umbilicus 0.7 cm irregular brown macule   Assessment & Plan   SKIN CANCER SCREENING PERFORMED TODAY.  ACTINIC DAMAGE - Chronic condition, secondary to cumulative UV/sun exposure - diffuse scaly erythematous macules with underlying dyspigmentation - Recommend daily broad spectrum sunscreen SPF  30+ to sun-exposed areas, reapply every 2 hours as needed.  - Staying in the shade or wearing long sleeves, sun glasses (UVA+UVB protection) and wide brim hats (4-inch brim around the entire circumference of the hat) are also recommended for sun protection.  - Call for new or changing lesions.  LENTIGINES, SEBORRHEIC KERATOSES, HEMANGIOMAS - Benign normal skin lesions - Benign-appearing - Call for any changes  MELANOCYTIC NEVI - Tan-brown and/or pink-flesh-colored symmetric macules and papules - Benign appearing on exam today - Observation - Call clinic for new or changing moles - Recommend daily use of broad spectrum spf 30+ sunscreen to sun-exposed areas.   Acrochordons (Skin Tags) - Fleshy, skin-colored pedunculated papules - Benign appearing.  - Observe. - If desired, they can be removed with an in office procedure that is not covered by insurance. - Please call the clinic if you notice any new or changing lesions.  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  - Call if any new or changing lesions are noted between office visits  History of Melanoma Patient reports  history of melanoma In 1990 to 1991 at right posterior shoulder (scar) , left posterior leg, back and right face  Done by Dr. Carmin Chow at [facility] in [place]  - No evidence of recurrence today - No lymphadenopathy -  Recommend regular full body skin exams - Recommend daily broad spectrum sunscreen SPF 30+ to sun-exposed areas, reapply every 2 hours as needed.  - Call if any new or changing lesions are noted between office visits  FAMILY HISTORY OF SKIN CANCER What type(s):melanoma Who affected:mother passed in 1989  INFLAMED SEBORRHEIC KERATOSIS (21) left elbow x 1, right shoulder x 20 (21) Symptomatic, irritating, patient would like treated. Destruction of lesion - left elbow x 1, right shoulder x 20 (21) Complexity: simple   Destruction method: cryotherapy   Informed consent: discussed and consent obtained   Timeout:  patient name, date of birth, surgical site, and procedure verified Lesion destroyed using liquid nitrogen: Yes   Region frozen until ice ball extended beyond lesion: Yes   Outcome: patient tolerated procedure well with no complications   Post-procedure details: wound care instructions given   NEOPLASM OF UNCERTAIN BEHAVIOR right abdomen lateral to superior umbilicus Epidermal / dermal shaving  Lesion diameter (cm):  0.7 Informed consent: discussed and consent obtained   Timeout: patient name, date of birth, surgical site, and procedure verified   Procedure prep:  Patient was prepped and draped in usual sterile fashion Prep type:  Isopropyl alcohol  Anesthesia: the lesion was anesthetized in a standard fashion   Anesthetic:  1% lidocaine  w/ epinephrine  1-100,000 buffered w/ 8.4% NaHCO3 Instrument used: flexible razor blade   Hemostasis achieved with: pressure, aluminum chloride and electrodesiccation   Outcome: patient tolerated procedure well   Post-procedure details: sterile dressing applied and wound care instructions given   Dressing type: bandage and petrolatum   Specimen 1 - Surgical pathology Differential Diagnosis: R/o sk vs dysplastic nevus   Check Margins: yes R/o sk vs dysplastic nevus  Return for 9 month tbse .  IRandee Busing, CMA, am acting as scribe for  Celine Collard, MD.   Documentation: I have reviewed the above documentation for accuracy and completeness, and I agree with the above.  Celine Collard, MD

## 2023-12-24 NOTE — Patient Instructions (Addendum)

## 2023-12-26 ENCOUNTER — Telehealth: Payer: Self-pay

## 2023-12-26 NOTE — Telephone Encounter (Signed)
 [facility] in [place] was contacted and they informed us  patient has not been since the 1980's and did not have any records on patient regarding his previous treatments.

## 2023-12-27 ENCOUNTER — Ambulatory Visit: Payer: Self-pay | Admitting: Dermatology

## 2023-12-27 LAB — SURGICAL PATHOLOGY

## 2023-12-31 NOTE — Telephone Encounter (Signed)
-----   Message from Celine Collard sent at 12/27/2023  5:16 PM EDT ----- FINAL DIAGNOSIS        1. Skin, right abdomen lateral to superior umbilicus :       JUNCTIONAL LENTIGINOUS MELANOCYTIC NEVUS   Benign mole No further treatment needed ----- Message ----- From: Interface, Lab In Three Zero One Sent: 12/27/2023   4:59 PM EDT To: Elta Halter, MD

## 2023-12-31 NOTE — Telephone Encounter (Signed)
Left message on voicemail to return my call.  

## 2024-01-01 NOTE — Telephone Encounter (Addendum)
 Called and discussed bx results with patient. He verbalized understanding and denied further questions  ----- Message from Celine Collard sent at 12/27/2023  5:16 PM EDT ----- FINAL DIAGNOSIS        1. Skin, right abdomen lateral to superior umbilicus :       JUNCTIONAL LENTIGINOUS MELANOCYTIC NEVUS   Benign mole No further treatment needed ----- Message ----- From: Interface, Lab In Three Zero One Sent: 12/27/2023   4:59 PM EDT To: Elta Halter, MD

## 2024-06-04 ENCOUNTER — Other Ambulatory Visit

## 2024-06-04 ENCOUNTER — Encounter: Payer: Self-pay | Admitting: Endocrinology

## 2024-06-04 ENCOUNTER — Ambulatory Visit: Admitting: Endocrinology

## 2024-06-04 VITALS — BP 128/84 | HR 94 | Resp 20 | Ht 70.0 in | Wt 257.8 lb

## 2024-06-04 DIAGNOSIS — E042 Nontoxic multinodular goiter: Secondary | ICD-10-CM

## 2024-06-04 DIAGNOSIS — E049 Nontoxic goiter, unspecified: Secondary | ICD-10-CM

## 2024-06-04 LAB — T4, FREE: Free T4: 1.5 ng/dL (ref 0.8–1.8)

## 2024-06-04 LAB — TSH: TSH: 0.29 m[IU]/L — ABNORMAL LOW (ref 0.40–4.50)

## 2024-06-04 NOTE — Progress Notes (Unsigned)
 Outpatient Endocrinology Note Staci Carver, MD  06/05/24  Patient's Name: Jackson GOVONI Sr.    DOB: 09/18/1950    MRN: [id number]  REASON OF VISIT: Follow-up for thyroid  nodules   PCP: Auston Reyes JONETTA, MD  REFERRING PROVIDER: Rosey Christopher JONETTA, MD   HISTORY OF PRESENT ILLNESS:   Jackson DAMON Sr. is a 73 y.o. old male with past medical history as listed below is presented for follow-up for thyroid  nodules / left retrosternal goiter.  Pertinent Thyroid  History:  In May 2024 patient had vision issue, had carotid ultrasound at [facility] and found to have thyroid  nodules, he subsequently had ultrasound thyroid  on Dec 01, 2022, showed multiple left thyroid  nodules largest left thyroid  nodule measuring 7.8 cm solid, heterogenous, left inferior thyroid  nodule measuring 4.5 cm and left mid thyroid  nodule measuring 3.6 cm in maximal dimension, there are other smaller left thyroid  nodules.  He had right thyroid  lobe, status post right hemithyroidectomy in early 2000. Patient reports he had s/p right hemithyroidectomy, at [facility], 2004 for thyroid  mass, reportedly not cancer.  No records available to review. 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 -Patient was seen by endocrinology at [facility], [place], at [facility], by Dr. Standley in June 2024, was planned for biopsy of 2 of larger left thyroid  nodules.  Patient reports he had biopsy however sample was not enough and was not able to get the cytology report.  No records available to review. -Initial Endo consultation in June 12, 2023 : plan for FNA of 3 of the left thyroid  nodules, inferior left thyroid  nodule measuring 7.8 cm solid, heterogenous, left inferior thyroid  nodule measuring 4.5 cm and left mid thyroid  nodule measuring 3.6 cm in  maximal dimension.   - In July 12, 2023, he underwent needle aspiration biopsy of left inferior thyroid  nodule measuring 7.8 cm and left inferior thyroid  nodule measuring 4.5 cm : Cytologies were benign.  Clinical History: Left; Inferior 7.8cm;  Other 2 dimensions: 6.8 x 7.8cm  solid/almost completely solid isoechoic, TI- RADS - 3  Specimen Submitted:  A. THYROID , LEFT INFERIOR # 1, FINE NEEDLE  ASPIRATION    FINAL MICROSCOPIC DIAGNOSIS:  - Benign follicular nodule (Bethesda category II)   SPECIMEN ADEQUACY:  Satisfactory for evaluation   Clinical History: Left; Inferior #2, 4.5cm;  Other 2 dimensions: 3.9 x  4.3cm solid/almost completely solid isoechoic TI-RADS - 3  Specimen Submitted:  A. THYROID , LEFT INFERIOR # 2, FINE NEEDLE  ASPIRATION:   FINAL MICROSCOPIC DIAGNOSIS:  - Benign follicular nodule (Bethesda category II)   -No family history of thyroid  cancer.  She has half sister with thyroid  nodule.  -He has remained euthyroid, has never been on thyroid  medication despite having right hemithyroidectomy.  - Patient denies h/o significant radiation exposure or radiation treatment in the neck.  - Patient has occasional neck discomfort.  Patient complains of increasing neck discomfort, and pressure effect especially while wearing tie.  He has obstructive sleep apnea on CPAP.  Denies significant neck compressive symptoms in terms of dysphagia or difficulty breathing.  # Patient has type 2 diabetes mellitus, on glimepiride, managed by primary care provider.  Interval history : Patient presented for the follow-up of goiter/thyroid  nodules.  Patient denies worsening of the neck discomfort, no worsening of dysphagia.  He reports he occasionally  has dysphagia and noticed that may be slightly better.  No choking.  No difficulty breathing in any position.  Lately he has been getting posterior neck pain otherwise no new complaints.  No palpitation or heat intolerance.  No hypo and  hyperthyroid symptoms.  No change in voice.  No other complaints today.  REVIEW OF SYSTEMS:  As per history of present illness.   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   Cancer (HCC)    skin   Coronary artery disease    Diabetes mellitus without complication (HCC)    Dysplastic nevus 12/27/2017   left inf buttock   Dysplastic nevus 08/11/2019   right post shoulder/deltoid   Hypertension    Hypothyroidism    Lower back pain    Pre-diabetes    Prostate enlargement    Sleep apnea    CPAP    PAST SURGICAL HISTORY: Past Surgical History:  Procedure Laterality Date   broken wrist Right    CARDIAC CATHETERIZATION     x 2   CATARACT EXTRACTION W/PHACO Left 09/03/2018   Procedure: CATARACT EXTRACTION PHACO AND INTRAOCULAR LENS PLACEMENT (IOC);  Surgeon: Jaye Fallow, MD;  Location: [facility] ORS;  Service: Ophthalmology;  Laterality: Left;  US  00:26.8 CDE 3.12 Fluid Pack Lot # [id number]   CATARACT EXTRACTION W/PHACO Right 12/03/2018   Procedure: CATARACT EXTRACTION PHACO AND INTRAOCULAR LENS PLACEMENT (IOC)  RIGHT;  Surgeon: Jaye Fallow, MD;  Location[facility];  Service: Ophthalmology;  Laterality: Right;   CORONARY ANGIOPLASTY     STENT X 2   CORONARY STENT INTERVENTION N/A 04/15/2019   Procedure: CORONARY STENT INTERVENTION;  Surgeon: Ammon Blunt, MD;  Location: [facility] INVASIVE CV LAB;  Service: Cardiovascular;  Laterality: N/A;   HERNIA REPAIR     JOINT REPLACEMENT  05/2018   TKR   KNEE ARTHROPLASTY Left 06/03/2018   Procedure: COMPUTER ASSISTED TOTAL KNEE ARTHROPLASTY;  Surgeon: Mardee Lynwood SQUIBB, MD;  Location: [facility] ORS;  Service: Orthopedics;  Laterality: Left;   LEFT HEART CATH AND CORONARY ANGIOGRAPHY Left 04/15/2019   Procedure: LEFT HEART CATH AND CORONARY ANGIOGRAPHY;  Surgeon: Ammon Blunt, MD;   Location: [facility] INVASIVE CV LAB;  Service: Cardiovascular;  Laterality: Left;   SEPTOPLASTY     SKIN CANCER EXCISION     THYROID  SURGERY      ALLERGIES: Allergies  Allergen Reactions   Metoprolol  Shortness Of Breath    Hyperactivity    Demerol [Meperidine Hcl] Other (See Comments)    Patient gets extremely hyperactive per patient   Meperidine Other (See Comments)    I felt like I was leaving this world. Confusion   Doxycycline Palpitations    Difficulty sleeping, felt like his heart was beating in his stomach   Penicillins Itching, Rash and Other (See Comments)    Did it involve swelling of the face/tongue/throat, SOB, or low BP? No Did it involve sudden or severe rash/hives, skin peeling, or any reaction on the inside of your mouth or nose? No Did you need to seek medical attention at a hospital or doctor's office? No When did it last happen?      30 Years If all above answers are "NO", may proceed with cephalosporin use.      FAMILY HISTORY:  Family History  Problem Relation Age of Onset  Diabetes Mother    Diabetes Brother     SOCIAL HISTORY: Social History   Socioeconomic History   Marital status: Married    Spouse name: June    Number of children: 4   Years of education: Not on file   Highest education level: Not on file  Occupational History   Occupation: programmer, multimedia   Tobacco Use   Smoking status: Former    Current packs/day: 0.00    Average packs/day: 2.0 packs/day for 10.0 years (20.0 ttl pk-yrs)    Types: Cigarettes    Start date: 05/22/1969    Quit date: 05/23/1979    Years since quitting: 45.0   Smokeless tobacco: Never  Vaping Use   Vaping status: Never Used  Substance and Sexual Activity   Alcohol  use: Never   Drug use: Never   Sexual activity: Yes    Birth control/protection: None  Other Topics Concern   Not on file  Social History Narrative   Not on file   Social Drivers of Health   Financial Resource Strain: Low Risk  (10/22/2023)    Received from [facility]   Overall Financial Resource Strain (CARDIA)    Difficulty of Paying Living Expenses: Not hard at all  Recent Concern: Financial Resource Strain - Medium Risk (08/09/2023)   Received from [facility]   Overall Financial Resource Strain (CARDIA)    Difficulty of Paying Living Expenses: Somewhat hard  Food Insecurity: No Food Insecurity (10/22/2023)   Received from [facility]   Hunger Vital Sign    Within the past 12 months, you worried that your food would run out before you got the money to buy more.: Never true    Within the past 12 months, the food you bought just didn't last and you didn't have money to get more.: Never true  Recent Concern: Food Insecurity - Food Insecurity Present (08/09/2023)   Received from [facility]   Hunger Vital Sign    Worried About Running Out of Food in the Last Year: Sometimes true    Ran Out of Food in the Last Year: Sometimes true  Transportation Needs: No Transportation Needs (10/22/2023)   Received from [facility] - Transportation    In the past 12 months, has lack of transportation kept you from medical appointments or from getting medications?: No    Lack of Transportation (Non-Medical): No  Physical Activity: Unknown (04/15/2019)   Exercise Vital Sign    Days of Exercise per Week: 6 days    Minutes of Exercise per Session: Not on file  Stress: No Stress Concern Present (04/15/2019)   [employer] - Occupational Stress Questionnaire    Feeling of Stress : Only a little  Social Connections: Unknown (04/15/2019)   Social Connection and Isolation Panel    Frequency of Communication with Friends and Family: More than three times a week    Frequency of Social Gatherings with Friends and Family: Not on file    Attends Religious Services: Not on file    Active Member of Clubs or Organizations: Not on file     Attends Banker Meetings: Not on file    Marital Status: Not on file    MEDICATIONS:  Current Outpatient Medications  Medication Sig Dispense Refill   acetaminophen  (TYLENOL ) 500 MG tablet Take 500 mg by mouth daily as needed for moderate pain (pain score 4-6) (for pain.).  ALPRAZolam  (XANAX ) 0.5 MG tablet Take 0.5 mg by mouth at bedtime as needed for anxiety or sleep.      aspirin  EC 81 MG tablet Take 81 mg by mouth daily.     azelastine (ASTELIN) 0.1 % nasal spray Place into the nose.     calcipotriene  (DOVONOX) 0.005 % cream Apply topically 2 (two) times daily. 60 g 0   carvedilol  (COREG ) 6.25 MG tablet Take 6.25 mg by mouth 2 (two) times daily.     diltiazem  (CARDIZEM  CD) 240 MG 24 hr capsule Take 240 mg by mouth daily.     fluorouracil  (EFUDEX ) 5 % cream Apply topically 2 (two) times daily. 40 g 0   gabapentin  (NEURONTIN ) 400 MG capsule Take 400 mg by mouth at bedtime.     glimepiride (AMARYL) 2 MG tablet Take 0.5-1 tablets by mouth daily.     Ibuprofen-Acetaminophen  (ADVIL DUAL ACTION) 125-250 MG TABS Take 2 tablets by mouth in the morning and at bedtime.     ketoconazole  (NIZORAL ) 2 % cream Apply 1 application. topically 2 (two) times daily as needed.     losartan  (COZAAR ) 25 MG tablet Take 25 mg by mouth daily.     metFORMIN (GLUCOPHAGE-XR) 500 MG 24 hr tablet Take 500 mg by mouth 2 (two) times daily.     Multiple Vitamins-Minerals (MULTIVITAMIN WITH MINERALS) tablet Take 1 tablet by mouth daily.      rosuvastatin (CRESTOR) 5 MG tablet Take 1 tablet by mouth daily.     tamsulosin  (FLOMAX ) 0.4 MG CAPS capsule Take by mouth.     triamcinolone  (NASACORT ) 55 MCG/ACT AERO nasal inhaler Place 2 sprays into the nose daily.     No current facility-administered medications for this visit.    PHYSICAL EXAM: Vitals:   06/04/24 1325  BP: 128/84  Pulse: 94  Resp: 20  SpO2: 95%  Weight: 257 lb 12.8 oz (116.9 kg)  Height: 5' 10 (1.778 m)      Body mass index is  36.99 kg/m.  Wt Readings from Last 3 Encounters:  06/04/24 257 lb 12.8 oz (116.9 kg)  12/03/23 259 lb 6.4 oz (117.7 kg)  08/02/23 263 lb 9.6 oz (119.6 kg)     General: Well developed, well nourished male in no apparent distress.  HEENT: AT/[place], no external lesions. Hearing intact to the spoken word Eyes: EOMI. No stare, proptosis. Conjunctiva clear and no icterus. Neck: Trachea rightward shift, neck supple without appreciable thyromegaly or lymphadenopathy and left palpable thyroid  nodule +, anterior neck is scar present from thyroidectomy. Lungs: Clear to auscultation, no wheeze. Respirations not labored Heart: S1S2, Regular in rate and rhythm.  Abdomen: Soft, non tender, non distended Neurologic: Alert, oriented, normal speech, deep tendon biceps reflexes normal,  no gross focal neurological deficit Extremities: No pedal pitting edema, no tremors of outstretched hands Skin: Warm, color good.  Psychiatric: Does not appear depressed or anxious  PERTINENT HISTORIC LABORATORY AND IMAGING STUDIES:  All pertinent laboratory results were reviewed. Please see HPI also for further details.   TSH  Date Value Ref Range Status  06/04/2024 0.29 (L) 0.40 - 4.50 mIU/L Final  06/12/2023 0.50 0.40 - 4.50 mIU/L Final  06/04/2018 0.892 0.350 - 4.500 uIU/mL Final    Comment:    Performed by a 3rd Generation assay with a functional sensitivity of <=0.01 uIU/mL. Performed at [facility], [address], [place], [place] [address]    Ultrasound thyroid : Dec 01, 2022 reviewed images.  CLINICAL DATA:  Nodule.  Previous hemithyroidectomy 2004.   EXAM: THYROID  ULTRASOUND   TECHNIQUE: Ultrasound examination of the thyroid  gland and adjacent soft tissues was performed.   COMPARISON:  None Available.   FINDINGS: Parenchymal Echotexture: Markedly heterogenous   Isthmus: 0.5 cm thickness   Right lobe: Surgically absent   Left lobe: 10 x 2.6 x 2.8 cm    _________________________________________________________   Estimated total number of nodules >/= 1 cm: 5   Number of spongiform nodules >/=  2 cm not described below (TR1): 0   Number of mixed cystic and solid nodules >/= 1.5 cm not described below (TR2): 0   _________________________________________________________   Nodule # 1:   Location: Left; inferior   Maximum size: 7.8 cm; Other 2 dimensions: 6.8 x 7.8 cm   Composition: solid/almost completely solid (2)   Echogenicity: isoechoic (1)   Shape: not taller-than-wide (0)   Margins: ill-defined (0)   Echogenic foci: none (0)   ACR TI-RADS total points: 3.   ACR TI-RADS risk category: TR 3.   ACR TI-RADS recommendations:   **Given size (>/= 2.5 cm) and appearance, fine needle aspiration of this mildly suspicious nodule should be considered based on TI-RADS criteria.   _________________________________________________________   Nodule # 2:   Location: Left; inferior   Maximum size: 4.5 cm; Other 2 dimensions: 3.9 x 4.3 cm   Composition: solid/almost completely solid (2)   Echogenicity: isoechoic (1)   Shape: not taller-than-wide (0)   Margins: ill-defined (0)   Echogenic foci: none (0)   ACR TI-RADS total points: 3.   ACR TI-RADS risk category: TR 3.   ACR TI-RADS recommendations:   **Given size (>/= 2.5 cm) and appearance, fine needle aspiration of this mildly suspicious nodule should be considered based on TI-RADS criteria.   _________________________________________________________   Nodule # 3:   Location: Left; medial   Maximum size: 1.7 cm; Other 2 dimensions: 1.2 x 1.3 cm   Composition: solid/almost completely solid (2)   Echogenicity: isoechoic (1)   Shape: not taller-than-wide (0)   Margins: ill-defined (0)   Echogenic foci: none (0)   ACR TI-RADS total points: 3.   ACR TI-RADS risk category: TR 3.   ACR TI-RADS recommendations:   *Given size (>/= 1.5 - 2.4 cm) and  appearance, a follow-up ultrasound in 1 year should be considered based on TI-RADS criteria.   _________________________________________________________   Nodule # 4:   Location: Left; mid   Maximum size: 3.6 cm; Other 2 dimensions: 3.4 x 3.4 cm   Composition: solid/almost completely solid (2)   Echogenicity: isoechoic (1)   Shape: not taller-than-wide (0)   Margins: ill-defined (0)   Echogenic foci: none (0)   ACR TI-RADS total points: 3.   ACR TI-RADS risk category: TR 3.   ACR TI-RADS recommendations:   **Given size (>/= 2.5 cm) and appearance, fine needle aspiration of this mildly suspicious nodule should be considered based on TI-RADS criteria.   _________________________________________________________   Nodule # 5:   Location: Left; superior   Maximum size: 2.1 cm; Other 2 dimensions: 1.1 x 1.9 cm   Composition: solid/almost completely solid (2)   Echogenicity: isoechoic (1)   Shape: not taller-than-wide (0)   Margins: ill-defined (0)   Echogenic foci: none (0)   ACR TI-RADS total points: 3.   ACR TI-RADS risk category: TR 3.   ACR TI-RADS recommendations:   *Given size (>/= 1.5 - 2.4 cm) and appearance, a follow-up ultrasound in 1 year should be considered based on TI-RADS  criteria.   _________________________________________________________   No regional cervical adenopathy identified.   IMPRESSION: 1. No residual/recurrent tissue post right hemithyroidectomy. 2. Thyromegaly with multiple left mildly suspicious nodules. In the absence of previous studies demonstrating stability, current guidelines recommend biopsy of the 2 largest lesions. 3. Recommend annual/biennial ultrasound follow-up of additional nodules as above, until stability x5 years confirmed.    IMPRESSION: US  thyroid  10/31/2023: reviewed images and compared.  1. Surgical changes of prior right hemithyroidectomy. 2. Similar appearance of diffusely enlarged and goitrous  left thyroid  gland. 3. The previously biopsied nodule # 1 in the left lower gland is slightly smaller in size. Involution over time is consistent with benignity. No further follow-up recommended. 4. The other previously biopsied nodule in the left lower gland is no longer confidently identified and appears to have blended in with the background goitrous change. 5. No new nodules or suspicious features.  ASSESSMENT / PLAN  1. Multiple thyroid  nodules   2. Retrosternal thyroid  goiter    -Patient is known to have thyroid  goiter, status post right hemithyroidectomy in August 2004, with benign pathology reportedly.  On imaging test she was found to have multiple left thyroid  nodules, on ultrasound was in May 2024.  With the available imaging studies, CT chest from 2019-2024 reviewed, left enlarged thyroid  lobe, with retrosternal extension into mediastinum and rightward shift of trachea. -He is euthyroid.  He has some neck discomfort and occasional dysphagia, no other significant neck compressive symptoms. No family history of thyroid  cancer.  No radiation exposure. -He underwent FNA of two left inferior thyroid  nodules measuring 7.8 and 4.5 cm in December 2024 with benign cytology.  He has left mid thyroid  nodule measuring 3.6 cm has not been biopsied yet. - Ultrasound thyroid  in April 2025 with relatively stable left thyroid  nodules. - No worsening of neck discomfort or dysphagia.  Plan: -No plan for additional FNA/biopsy of thyroid  nodule at this time.  If the neck compressive symptoms become significant or worsening thyroid  surgery can be considered.  We also discussed that it is okay to watch clinically and with imaging.  ENT referral and evaluation of thyroid  surgery can be considered.  We discussed and for now decided to monitor clinically and with imaging as needed for now. - If his neck compressive symptoms/intermittent dysphagia increases will refer to ENT.   - Consider CT neck/chest to  monitor retrosternal goiter. -Will plan to check ultrasound thyroid  in 6 months prior to follow-up visit.  In 1 to 2 years we will also plan to check CT neck and chest to monitor retrosternal goiter. -Patient is asked to call our clinic with any new or worsening neck related symptoms. - Check thyroid  function test today.  Diagnoses and all orders for this visit:  Multiple thyroid  nodules -     T4, free -     TSH -     US  THYROID ; Future  Retrosternal thyroid  goiter -     T4, free -     TSH -     US  THYROID ; Future    Labs reviewed mildly low TSH.  He has no hyperthyroid symptoms at the clinic visit.  Normal free T4.  Will continue to monitor.  Will recheck lab in the follow-up visit.      Latest Reference Range & Units 06/04/24 13:50  TSH 0.40 - 4.50 mIU/L 0.29 (L)  T4,Free(Direct) 0.8 - 1.8 ng/dL 1.5  (L): Data is abnormally low  DISPOSITION Follow up in clinic in 6 months suggested.  All questions answered and patient verbalized understanding of the plan.  Carrick Rijos, MD [facility] Endocrinology [facility] [address] [place], [address] [place], [place] [address] Phone # [phone]  At least part of this note was generated using voice recognition software. Inadvertent word errors may have occurred, which were not recognized during the proofreading process.

## 2024-06-05 ENCOUNTER — Ambulatory Visit: Payer: Self-pay | Admitting: Endocrinology

## 2024-06-05 ENCOUNTER — Encounter: Payer: Self-pay | Admitting: Endocrinology

## 2024-09-22 ENCOUNTER — Ambulatory Visit: Admitting: Dermatology

## 2025-01-05 ENCOUNTER — Ambulatory Visit: Admitting: Endocrinology
